# Patient Record
Sex: Male | Born: 1955 | Race: White | Hispanic: No | Marital: Single | Smoking: Former smoker
Health system: Southern US, Community
[De-identification: ages and names within clinical notes are randomized; demographics above are authoritative.]

## PROBLEM LIST (undated history)

## (undated) DIAGNOSIS — G4710 Hypersomnia, unspecified: Secondary | ICD-10-CM

## (undated) DIAGNOSIS — G4730 Sleep apnea, unspecified: Secondary | ICD-10-CM

## (undated) DIAGNOSIS — Z87442 Personal history of urinary calculi: Secondary | ICD-10-CM

## (undated) DIAGNOSIS — E785 Hyperlipidemia, unspecified: Secondary | ICD-10-CM

## (undated) HISTORY — DX: Sleep apnea, unspecified: G47.30

## (undated) HISTORY — DX: Personal history of urinary calculi: Z87.442

## (undated) HISTORY — DX: Hypersomnia, unspecified: G47.10

## (undated) HISTORY — DX: Hyperlipidemia, unspecified: E78.5

---
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
## 1979-03-20 HISTORY — PX: BACK SURGERY: SHX140

## 1999-11-22 ENCOUNTER — Encounter: Payer: Self-pay | Admitting: Emergency Medicine

## 1999-11-22 ENCOUNTER — Emergency Department (HOSPITAL_COMMUNITY): Admission: EM | Admit: 1999-11-22 | Discharge: 1999-11-22 | Payer: Self-pay | Admitting: Emergency Medicine

## 2007-04-03 ENCOUNTER — Ambulatory Visit: Payer: Self-pay | Admitting: Internal Medicine

## 2007-04-03 DIAGNOSIS — Z87442 Personal history of urinary calculi: Secondary | ICD-10-CM

## 2007-04-03 DIAGNOSIS — E785 Hyperlipidemia, unspecified: Secondary | ICD-10-CM

## 2007-04-03 DIAGNOSIS — G4710 Hypersomnia, unspecified: Secondary | ICD-10-CM

## 2007-04-03 DIAGNOSIS — G4730 Sleep apnea, unspecified: Secondary | ICD-10-CM

## 2007-04-03 HISTORY — DX: Personal history of urinary calculi: Z87.442

## 2007-04-03 HISTORY — DX: Sleep apnea, unspecified: G47.10

## 2007-04-03 HISTORY — DX: Sleep apnea, unspecified: G47.30

## 2007-04-03 HISTORY — DX: Hyperlipidemia, unspecified: E78.5

## 2007-04-11 ENCOUNTER — Telehealth: Payer: Self-pay | Admitting: Internal Medicine

## 2007-04-24 ENCOUNTER — Encounter: Payer: Self-pay | Admitting: Internal Medicine

## 2007-04-24 ENCOUNTER — Ambulatory Visit (HOSPITAL_BASED_OUTPATIENT_CLINIC_OR_DEPARTMENT_OTHER): Admission: RE | Admit: 2007-04-24 | Discharge: 2007-04-24 | Payer: Self-pay | Admitting: Internal Medicine

## 2007-05-06 ENCOUNTER — Ambulatory Visit: Payer: Self-pay | Admitting: Pulmonary Disease

## 2007-05-12 ENCOUNTER — Telehealth: Payer: Self-pay | Admitting: Internal Medicine

## 2007-05-14 ENCOUNTER — Encounter: Payer: Self-pay | Admitting: Internal Medicine

## 2007-05-27 ENCOUNTER — Ambulatory Visit: Payer: Self-pay | Admitting: Internal Medicine

## 2007-05-30 LAB — CONVERTED CEMR LAB
Cholesterol: 219 mg/dL (ref 0–200)
Direct LDL: 135.7 mg/dL
HDL: 31.5 mg/dL — ABNORMAL LOW (ref >39.0)
Total CHOL/HDL Ratio: 7
Triglycerides: 221 mg/dL (ref 0–149)
VLDL: 44 mg/dL — ABNORMAL HIGH (ref 0–40)

## 2007-08-28 ENCOUNTER — Ambulatory Visit: Payer: Self-pay | Admitting: Internal Medicine

## 2007-09-01 ENCOUNTER — Ambulatory Visit: Payer: Self-pay | Admitting: Internal Medicine

## 2010-02-23 ENCOUNTER — Ambulatory Visit: Payer: Self-pay | Admitting: Internal Medicine

## 2010-02-23 LAB — CONVERTED CEMR LAB
ALT: 34 units/L (ref 0–53)
AST: 25 units/L (ref 0–37)
Albumin: 4 g/dL (ref 3.5–5.2)
BUN: 17 mg/dL (ref 6–23)
Basophils Relative: 0.5 % (ref 0.0–3.0)
Blood in Urine, dipstick: NEGATIVE
Chloride: 105 meq/L (ref 96–112)
Eosinophils Relative: 1.8 % (ref 0.0–5.0)
GFR calc non Af Amer: 67.89 mL/min (ref >60.00)
Glucose, Bld: 157 mg/dL — ABNORMAL HIGH (ref 70–99)
Glucose, Urine, Semiquant: NEGATIVE
HCT: 44.8 % (ref 39.0–52.0)
HDL: 33.7 mg/dL — ABNORMAL LOW (ref >39.00)
Hemoglobin: 15.7 g/dL (ref 13.0–17.0)
Lymphs Abs: 2.1 10*3/uL (ref 0.7–4.0)
MCV: 91.5 fL (ref 78.0–100.0)
Monocytes Absolute: 0.5 10*3/uL (ref 0.1–1.0)
Monocytes Relative: 9 % (ref 3.0–12.0)
Neutro Abs: 3 10*3/uL (ref 1.4–7.7)
Nitrite: NEGATIVE
PSA: 0.25 ng/mL (ref 0.10–4.00)
Potassium: 4.3 meq/L (ref 3.5–5.1)
Sodium: 140 meq/L (ref 135–145)
Specific Gravity, Urine: 1.025
TSH: 1.42 microintl units/mL (ref 0.35–5.50)
Total Protein: 7.1 g/dL (ref 6.0–8.3)
VLDL: 83.4 mg/dL — ABNORMAL HIGH (ref 0.0–40.0)
WBC Urine, dipstick: NEGATIVE
WBC: 5.8 10*3/uL (ref 4.5–10.5)
pH: 5.5

## 2010-03-03 ENCOUNTER — Encounter: Payer: Self-pay | Admitting: Internal Medicine

## 2010-03-03 ENCOUNTER — Ambulatory Visit: Payer: Self-pay | Admitting: Internal Medicine

## 2010-03-03 DIAGNOSIS — N529 Male erectile dysfunction, unspecified: Secondary | ICD-10-CM | POA: Insufficient documentation

## 2010-03-03 DIAGNOSIS — E1165 Type 2 diabetes mellitus with hyperglycemia: Secondary | ICD-10-CM

## 2010-03-07 ENCOUNTER — Encounter: Payer: Self-pay | Admitting: Gastroenterology

## 2010-03-09 LAB — CONVERTED CEMR LAB
Testosterone-% Free: 2.5 % (ref 1.6–2.9)
Testosterone: 278.36 ng/dL (ref 250–890)

## 2010-04-04 ENCOUNTER — Encounter (INDEPENDENT_AMBULATORY_CARE_PROVIDER_SITE_OTHER): Payer: Self-pay | Admitting: *Deleted

## 2010-04-06 ENCOUNTER — Ambulatory Visit
Admission: RE | Admit: 2010-04-06 | Discharge: 2010-04-06 | Payer: Self-pay | Source: Home / Self Care | Attending: Gastroenterology | Admitting: Gastroenterology

## 2010-04-12 ENCOUNTER — Ambulatory Visit
Admission: RE | Admit: 2010-04-12 | Discharge: 2010-04-12 | Payer: Self-pay | Source: Home / Self Care | Attending: Internal Medicine | Admitting: Internal Medicine

## 2010-04-12 ENCOUNTER — Other Ambulatory Visit: Payer: Self-pay | Admitting: Internal Medicine

## 2010-04-12 LAB — LIPID PANEL
Cholesterol: 187 mg/dL (ref 0–200)
HDL: 32.3 mg/dL — ABNORMAL LOW (ref >39.00)
Total CHOL/HDL Ratio: 6
Triglycerides: 206 mg/dL — ABNORMAL HIGH (ref 0.0–149.0)
VLDL: 41.2 mg/dL — ABNORMAL HIGH (ref 0.0–40.0)

## 2010-04-12 LAB — HEPATIC FUNCTION PANEL
ALT: 35 U/L (ref 0–53)
AST: 26 U/L (ref 0–37)
Albumin: 4.1 g/dL (ref 3.5–5.2)
Alkaline Phosphatase: 76 U/L (ref 39–117)
Bilirubin, Direct: 0.1 mg/dL (ref 0.0–0.3)
Total Bilirubin: 0.9 mg/dL (ref 0.3–1.2)
Total Protein: 7.1 g/dL (ref 6.0–8.3)

## 2010-04-12 LAB — LDL CHOLESTEROL, DIRECT: Direct LDL: 120.8 mg/dL

## 2010-04-15 ENCOUNTER — Encounter: Payer: Self-pay | Admitting: Internal Medicine

## 2010-04-16 LAB — CONVERTED CEMR LAB
ALT: 34 units/L (ref 0–53)
AST: 24 units/L (ref 0–37)
Albumin: 4.2 g/dL (ref 3.5–5.2)
Alkaline Phosphatase: 84 units/L (ref 39–117)
BUN: 16 mg/dL (ref 6–23)
Basophils Absolute: 0 10*3/uL (ref 0.0–0.1)
Basophils Relative: 0.3 % (ref 0.0–1.0)
Bilirubin, Direct: 0.1 mg/dL (ref 0.0–0.3)
CO2: 29 meq/L (ref 19–32)
Calcium: 9.6 mg/dL (ref 8.4–10.5)
Chloride: 104 meq/L (ref 96–112)
Creatinine, Ser: 1 mg/dL (ref 0.4–1.5)
Eosinophils Absolute: 0.1 10*3/uL (ref 0.0–0.6)
Eosinophils Relative: 2.2 % (ref 0.0–5.0)
GFR calc Af Amer: 102 mL/min
GFR calc non Af Amer: 84 mL/min
Glucose, Bld: 107 mg/dL — ABNORMAL HIGH (ref 70–99)
HCT: 47.7 % (ref 39.0–52.0)
Hemoglobin: 16.6 g/dL (ref 13.0–17.0)
Lymphocytes Relative: 35.6 % (ref 12.0–46.0)
MCHC: 34.7 g/dL (ref 30.0–36.0)
MCV: 91.8 fL (ref 78.0–100.0)
Monocytes Absolute: 0.5 10*3/uL (ref 0.2–0.7)
Monocytes Relative: 7.8 % (ref 3.0–11.0)
Neutro Abs: 3.6 10*3/uL (ref 1.4–7.7)
Neutrophils Relative %: 54.1 % (ref 43.0–77.0)
Platelets: 235 10*3/uL (ref 150–400)
Potassium: 4.9 meq/L (ref 3.5–5.1)
RBC: 5.19 M/uL (ref 4.22–5.81)
RDW: 12.2 % (ref 11.5–14.6)
Sodium: 141 meq/L (ref 135–145)
TSH: 1.42 microintl units/mL (ref 0.35–5.50)
Total Bilirubin: 0.9 mg/dL (ref 0.3–1.2)
Total Protein: 7.3 g/dL (ref 6.0–8.3)
WBC: 6.5 10*3/uL (ref 4.5–10.5)

## 2010-04-19 ENCOUNTER — Encounter: Payer: Self-pay | Admitting: Internal Medicine

## 2010-04-19 ENCOUNTER — Ambulatory Visit: Admit: 2010-04-19 | Payer: Self-pay | Admitting: Internal Medicine

## 2010-04-19 ENCOUNTER — Ambulatory Visit (INDEPENDENT_AMBULATORY_CARE_PROVIDER_SITE_OTHER): Payer: 59 | Admitting: Internal Medicine

## 2010-04-19 ENCOUNTER — Other Ambulatory Visit: Payer: Self-pay | Admitting: Internal Medicine

## 2010-04-19 VITALS — BP 140/80 | HR 72 | Temp 98.3°F | Resp 14 | Ht 73.0 in | Wt 250.0 lb

## 2010-04-19 DIAGNOSIS — R079 Chest pain, unspecified: Secondary | ICD-10-CM

## 2010-04-19 DIAGNOSIS — E785 Hyperlipidemia, unspecified: Secondary | ICD-10-CM

## 2010-04-19 LAB — CK TOTAL AND CKMB (NOT AT ARMC)
Relative Index: 1.4 (ref 0.0–2.5)
Total CK: 139 U/L (ref 7–232)

## 2010-04-19 MED ORDER — TADALAFIL 20 MG PO TABS: 20.0000 mg | ORAL_TABLET | Freq: Every day | ORAL | Status: DC | PRN

## 2010-04-19 MED ORDER — PITAVASTATIN CALCIUM 2 MG PO TABS: 2.0000 mg | ORAL_TABLET | ORAL | Status: DC

## 2010-04-20 DIAGNOSIS — R079 Chest pain, unspecified: Secondary | ICD-10-CM | POA: Insufficient documentation

## 2010-04-20 LAB — D-DIMER, QUANTITATIVE: D-Dimer, Quant: 0.22 ug/mL-FEU (ref 0.00–0.48)

## 2010-04-20 NOTE — Letter (Signed)
Summary: Pre Visit Letter Revised  [facility]  [address] [place], [place] [address]   Phone: [phone]  Fax: [phone]        03/07/2010 MRN: [id number]  Ronnie Ortiz [address] [place], [place]  [address]             Procedure Date:  04-24-10 9:30am           Dr Jarold Motto   Welcome to the Gastroenterology Division at [facility].    You are scheduled to see a nurse for your pre-procedure visit on 04-06-2010 at 8am on the 3rd floor at [facility], [address]  We ask that you try to arrive at our office 15 minutes prior to your appointment time to allow for check-in.  Please take a minute to review the attached form.  If you answer "Yes" to one or more of the questions on the first page, we ask that you call the person listed at your earliest opportunity.  If you answer "No" to all of the questions, please complete the rest of the form and bring it to your appointment.    Your nurse visit will consist of discussing your medical and surgical history, your immediate family medical history, and your medications.   If you are unable to list all of your medications on the form, please bring the medication bottles to your appointment and we will list them.  We will need to be aware of both prescribed and over the counter drugs.  We will need to know exact dosage information as well.    Please be prepared to read and sign documents such as consent forms, a financial agreement, and acknowledgement forms.  If necessary, and with your consent, a friend or relative is welcome to sit-in on the nurse visit with you.  Please bring your insurance card so that we may make a copy of it.  If your insurance requires a referral to see a specialist, please bring your referral form from your primary care physician.  No co-pay is required for this nurse visit.     If you cannot keep your appointment, please call [phone] to cancel or reschedule prior to your  appointment date.  This allows [place] the opportunity to schedule an appointment for another patient in need of care.    Thank you for choosing [facility] for your medical needs.  We appreciate the opportunity to care for you.  Please visit [place] at our website  to learn more about our practice.  Sincerely, The Gastroenterology Division

## 2010-04-20 NOTE — Assessment & Plan Note (Signed)
Summary: CPX//CCM   Vital Signs:  Patient profile:   55 year old male Height:      72 inches Weight:      258 pounds BMI:     35.12 Temp:     98.4 degrees F oral Pulse rate:   72 / minute Pulse rhythm:   regular BP sitting:   124 / 82  (left arm) Cuff size:   large  Vitals Entered By: Alfred Levins, CMA (March 03, 2010 7:59 AM) CC: cpx   CC:  cpx.  History of Present Illness: CPX  new problem: unable to maintain erection libido decreased significantly  Current Problems (verified): 1)  Hypersomnia, Associated With Sleep Apnea  (ICD-780.53) 2)  Nephrolithiasis, Hx of  (ICD-V13.01) 3)  Hyperlipidemia  (ICD-272.4) 4)  Preventive Health Care  (ICD-V70.0)  Current Medications (verified): 1)  No Medications  Allergies (verified): No Known Drug Allergies  Past History:  Past Surgical History: Last updated: 04/03/2007 back surgery  1981 knee cap replacement-1970s  Family History: Last updated: 03/03/2010 father Cancer-pancreatic CA-84yo mother CHF-78yo deceased, diabetes brother MI 53 yo---deceased  Social History: Last updated: 04/03/2007 Single 2 kids healthy Occupation:[employer].  Former Smoker Regular exercise-no  Risk Factors: Exercise: no (04/03/2007)  Risk Factors: Smoking Status: quit (04/03/2007)  Past Medical History: Unremarkable Hyperlipidemia Nephrolithiasis, hx of hyperglycemia  Family History: father Cancer-pancreatic CA-84yo mother CHF-78yo deceased, diabetes brother MI 46 yo---deceased  Physical Exam  General:  overweight male in no acute distress. HEENT exam atraumatic, normocephalic symmetric her muscles are intact. Neck is supple without lymphadenopathy or thyromegaly. Chest clear auscultation cardiac exam S1-S2 are regular. Abdominal exam obese, to bowel sounds, soft and nontender. Extremities no clubbing cyanosis edema. Neurologic exam is alert with a normal gait.   Impression & Recommendations:  Problem # 1:   PREVENTIVE HEALTH CARE (ICD-V70.0) health maint UTD see problems Orders: Gastroenterology Referral (GI)  Problem # 2:  HYPERGLYCEMIA (ICD-790.29)  needs further evaluation. there is a good chance hei has diabetes. No family history. We'll check A1c.  Orders: Venipuncture (47829) TLB-A1C / Hgb A1C (Glycohemoglobin) (83036-A1C)  Problem # 3:  ERECTILE DYSFUNCTION, ORGANIC (ICD-607.84)  needs further evaluation. Check testosterone level. I suspect this problems related to obesity, age and hyperlipidemia. Diabetes may be playing some role.  Orders: T-Testosterone, Free and Total [phone])  Problem # 4:  HYPERLIPIDEMIA (ICD-272.4)  needs treatment. He has tried medications in the past with muscular side effects. Will try Livalo Side effects discussed.Marland Kitchen  His updated medication list for this problem includes:    Livalo 2 Mg Tabs (Pitavastatin calcium) .Marland Kitchen... 1/2 by mouth every other day  Complete Medication List: 1)  Livalo 2 Mg Tabs (Pitavastatin calcium) .... 1/2 by mouth every other day  Patient Instructions: 1)  6 weeks 2)  lipids 272.4 3)  liver 995.2    Orders Added: 1)  Gastroenterology Referral [GI] 2)  Est. Patient 40-64 years [99396] 3)  Est. Patient Level III [62952] 4)  Venipuncture [36415] 5)  TLB-A1C / Hgb A1C (Glycohemoglobin) [83036-A1C] 6)  T-Testosterone, Free and Total [id number]

## 2010-04-20 NOTE — Assessment & Plan Note (Signed)
Much improved. I have advised weight loss. He needs to participate in daily exercise. Continue current medications.

## 2010-04-20 NOTE — Miscellaneous (Signed)
Summary: Previsit [facility]   Clinical Lists Changes  Medications: Added new medication of MOVIPREP 100 GM  SOLR (PEG-KCL-NACL-NASULF-NA ASC-C) As per prep instructions. - Signed Rx of MOVIPREP 100 GM  SOLR (PEG-KCL-NACL-NASULF-NA ASC-C) As per prep instructions.;  #1 x 0;  Signed;  Entered by: Durwin Glaze RN;  Authorized by: Mardella Layman MD [facility];  Method used: Electronically to [facility] [address] [phone].*, [address], [place], [place], [place]  [address], Ph: [phone], Fax: [phone] Observations: Added new observation of NKA: T (04/06/2010 7:45)    Prescriptions: MOVIPREP 100 GM  SOLR (PEG-KCL-NACL-NASULF-NA ASC-C) As per prep instructions.  #1 x 0   Entered by:   Durwin Glaze RN   Authorized by:   Mardella Layman MD [facility]   Signed by:   Durwin Glaze RN on 04/06/2010   Method used:   Electronically to        [facility] [address] [phone].* (retail)       [address]       [place], [place]  [address]       Ph: [phone]       Fax: [phone]   RxID:   [id number]

## 2010-04-20 NOTE — Assessment & Plan Note (Signed)
Patient complains of chest discomfort. The pain is atypical. It been ongoing for quite some time. I don't think it is cardiac. Will do routine blood testing. Get a chest x-ray. EKG looks unremarkable. if persists without identifiable cause I think he'll need some sort of stress testing.

## 2010-04-20 NOTE — Progress Notes (Signed)
  Subjective:    Patient ID: Ronnie Ortiz, male    DOB: [birth year], 55 y.o.   MRN: [id number]  HPI  Patient comes in for followup. He is here for followup of hyperlipidemia. He's been on a statin for the past 6 weeks. He's been out for the past week. He has not had any side effects. He is taking an extremely low dose of the medication.  The patient complains of constant left-sided chest pain for the past 2 weeks. Not associated with exertion, deep breathing, cough. He doesn't remember any injury. He knows of no associated symptoms. He describes the discomfort as a pressure not really a pain.  Review of Systems Patient denies shortness of breath, PND, orthopnea. Denies abdominal pain, change in appetite, muscle aches. Denies rashes. No other complaints and a complete review of systems.    Objective:   Physical Exam    well-developed well-nourished male in no acute distress. HEENT exam atraumatic, normocephalic symmetric extraocular are intact. Neck is supple. Chest clear to auscultation cardiac exam S1-S2 irregular. Abdomen; soft. Extremities no edema.    Assessment & Plan:

## 2010-04-20 NOTE — Progress Notes (Signed)
Pt aware normal results

## 2010-04-20 NOTE — Letter (Signed)
Summary: [facility] Instructions  [facility]  [address] [place], [place] [address]   Phone: [phone]  Fax: [phone]       Ronnie Ortiz    [birth year]    MRN: [id number]        Procedure Day /Date:  Monday 04/24/2010     Arrival Time: 8:30 am     Procedure Time: 9:30 am     Location of Procedure:                    _ x_  [facility] (4th Floor)                        PREPARATION FOR COLONOSCOPY WITH MOVIPREP   Starting 5 days prior to your procedure Wednesday 2/1 do not eat nuts, seeds, popcorn, corn, beans, peas,  salads, or any raw vegetables.  Do not take any fiber supplements (e.g. Metamucil, Citrucel, and Benefiber).  THE DAY BEFORE YOUR PROCEDURE         DATE: Sunday 2/5  1.  Drink clear liquids the entire day-NO SOLID FOOD  2.  Do not drink anything colored red or purple.  Avoid juices with pulp.  No orange juice.  3.  Drink at least 64 oz. (8 glasses) of fluid/clear liquids during the day to prevent dehydration and help the prep work efficiently.  CLEAR LIQUIDS INCLUDE: Water Jello Ice Popsicles Tea (sugar ok, no milk/cream) Powdered fruit flavored drinks Coffee (sugar ok, no milk/cream) Gatorade Juice: apple, white grape, white cranberry  Lemonade Clear bullion, consomm, broth Carbonated beverages (any kind) Strained chicken noodle soup Hard Candy                             4.  In the morning, mix first dose of MoviPrep solution:    Empty 1 Pouch A and 1 Pouch B into the disposable container    Add lukewarm drinking water to the top line of the container. Mix to dissolve    Refrigerate (mixed solution should be used within 24 hrs)  5.  Begin drinking the prep at 5:00 p.m. The MoviPrep container is divided by 4 marks.   Every 15 minutes drink the solution down to the next mark (approximately 8 oz) until the full liter is complete.   6.  Follow completed prep with 16 oz of clear liquid of your choice (Nothing  red or purple).  Continue to drink clear liquids until bedtime.  7.  Before going to bed, mix second dose of MoviPrep solution:    Empty 1 Pouch A and 1 Pouch B into the disposable container    Add lukewarm drinking water to the top line of the container. Mix to dissolve    Refrigerate  THE DAY OF YOUR PROCEDURE      DATE: Monday 2/6  Beginning at 4:30 a.m. (5 hours before procedure):         1. Every 15 minutes, drink the solution down to the next mark (approx 8 oz) until the full liter is complete.  2. Follow completed prep with 16 oz. of clear liquid of your choice.    3. You may drink clear liquids until 7:30 am (2 HOURS BEFORE PROCEDURE).   MEDICATION INSTRUCTIONS  Unless otherwise instructed, you should take regular prescription medications with a small sip of water   as early as possible the morning of your  procedure.         OTHER INSTRUCTIONS  You will need a responsible adult at least 54 years of age to accompany you and drive you home.   This person must remain in the waiting room during your procedure.  Wear loose fitting clothing that is easily removed.  Leave jewelry and other valuables at home.  However, you may wish to bring a book to read or  an iPod/MP3 player to listen to music as you wait for your procedure to start.  Remove all body piercing jewelry and leave at home.  Total time from sign-in until discharge is approximately 2-3 hours.  You should go home directly after your procedure and rest.  You can resume normal activities the  day after your procedure.  The day of your procedure you should not:   Drive   Make legal decisions   Operate machinery   Drink alcohol   Return to work  You will receive specific instructions about eating, activities and medications before you leave.    The above instructions have been reviewed and explained to me by   Durwin Glaze RN  April 06, 2010 8:04 AM     I fully understand and can verbalize  these instructions _____________________________ Date _________

## 2010-04-24 ENCOUNTER — Other Ambulatory Visit (AMBULATORY_SURGERY_CENTER): Payer: 59 | Admitting: Gastroenterology

## 2010-04-24 ENCOUNTER — Encounter: Payer: Self-pay | Admitting: Gastroenterology

## 2010-04-24 DIAGNOSIS — K5730 Diverticulosis of large intestine without perforation or abscess without bleeding: Secondary | ICD-10-CM

## 2010-04-24 DIAGNOSIS — Z1211 Encounter for screening for malignant neoplasm of colon: Secondary | ICD-10-CM

## 2010-05-04 NOTE — Procedures (Signed)
Summary: Colonoscopy   Colonoscopy  Procedure date:  04/24/2010  Findings:      Location:  [facility].    Procedures Next Due Date:    Colonoscopy: 04/2020 COLONOSCOPY PROCEDURE REPORT  PATIENT:  Ronnie Ortiz, Ronnie Ortiz  MR#:  [id number] BIRTHDATE:   06/16/1955, 54 yrs. old   GENDER:   male ENDOSCOPIST:   Vania Rea. Jarold Motto, MD, [facility] REF. BY: Birdie Sons, M.D. PROCEDURE DATE:  04/24/2010 PROCEDURE:  Diagnostic Colonoscopy ASA CLASS:   Class I INDICATIONS: Routine Risk Screening  MEDICATIONS:    Fentanyl 75 mcg IV, Versed 8 mg IV  DESCRIPTION OF PROCEDURE:   After the risks benefits and alternatives of the procedure were thoroughly explained, informed consent was obtained.  Digital rectal exam was performed and revealed tender.   The LB [id number] endoscope was introduced through the anus and advanced to the cecum, which was identified by both the appendix and ileocecal valve, without limitations.  The quality of the prep was excellent, using MoviPrep.  The instrument was then slowly withdrawn as the colon was fully examined. <<PROCEDUREIMAGES>>        <<OLD IMAGES>>  FINDINGS:  Moderate diverticulosis was found in the sigmoid to descending colon segments. SCATTERED RIGHT COLON TICS ALSO NOTED.   Retroflexed views in the rectum revealed no abnormalities.    The scope was then withdrawn from the patient and the procedure completed.  COMPLICATIONS:   None ENDOSCOPIC IMPRESSION:  1) Moderate diverticulosis in the sigmoid to descending colon segments  2) No polyps or cancers RECOMMENDATIONS:  1) high fiber diet  2) Continue current colorectal screening recommendations for "routine risk" patients with a repeat colonoscopy in 10 years. REPEAT EXAM:   No   _______________________________ Vania Rea. Jarold Motto, MD, [facility]  CC:

## 2010-05-29 LAB — HM COLONOSCOPY

## 2010-08-01 NOTE — Procedures (Signed)
NAME:  Ronnie Ortiz, Ronnie Ortiz NO.:  [id number]   MEDICAL RECORD NO.:  [id number]          PATIENT TYPE:  OUT   LOCATION:  SLEEP CENTER                 FACILITY:  [facility]   PHYSICIAN:  Barbaraann Share, MD,FCCPDATE OF BIRTH:  March 28, 1955   DATE OF STUDY:  04/24/2007                            NOCTURNAL POLYSOMNOGRAM   REFERRING PHYSICIAN:   INDICATION FOR STUDY:  Hypersomnia with sleep apnea.   EPWORTH SLEEPINESS SCORE:  Sixteen.   SLEEP ARCHITECTURE:  The patient had a total sleep time of 128 minutes  during the diagnostic portion of the study and 234 minutes during CPAP  titration.  There was decreased REM and the patient never achieved slow  wave sleep.  Sleep onset latency was normal at 27 minutes and REM onset  was prolonged at 145 minutes.  Sleep efficiency was poor at 66% during  the first half of the night and improved at 86% the second half of the  night.   RESPIRATORY DATA:  Patient underwent split night protocol where he was  found to have 30 obstructive and central events in the first 128 minutes  of sleep.  This gave him an apnea/hypopnea index of 14 events per hour  during the diagnostic portion of the study.  The events occurred  primarily during supine REM and there was mild snoring noted throughout.  Protocol:  The patient was then placed on a medium Quatro full face mask  and ultimately titrated to a final pressure of 9 cm.  At this pressure  the patient had very few breakthrough obstructive events but did have an  increase in central events that were probably being induced by the CPAP  pressure.  At this point I would recommend a treatment pressure of 8 cm  of water and see how the patient responds clinically.   OXYGEN DATA:  The patient had O2 desaturation as low as 83% with his  obstructive events.   CARDIAC DATA:  No clinically significant arrhythmias were noted.   MOVEMENT-PARASOMNIA:  None.   IMPRESSIONS-RECOMMENDATIONS:  Mild obstructive  sleep apnea/hypopnea  syndrome with an apnea/hypopnea index of 14 events per hour and O2  desaturation as low as 83% during the diagnostic portion of the study.  Patient was then placed on CPAP with a medium Quatro full face mask and  titrated to a pressure of 9 cm of  water.  Because of CPAP-induced central events, I would recommend a  treatment pressure of 8 cm of water and see how the patient responds  clinically.  He should also be counseled on weight loss.      Barbaraann Share, MD,FCCP  Diplomate, American Board of Sleep  Medicine  Electronically Signed     [facility]/MEDQ  D:  05/07/2007 08:45:56  T:  05/07/2007 16:42:41  Job:  [id number]

## 2010-11-01 ENCOUNTER — Ambulatory Visit
Admission: RE | Admit: 2010-11-01 | Discharge: 2010-11-01 | Disposition: A | Discharge status: Home / Self Care | Payer: 59 | Source: Ambulatory Visit | Attending: Internal Medicine | Admitting: Internal Medicine

## 2010-12-20 ENCOUNTER — Encounter: Payer: Self-pay | Admitting: Family Medicine

## 2010-12-20 ENCOUNTER — Ambulatory Visit (INDEPENDENT_AMBULATORY_CARE_PROVIDER_SITE_OTHER): Payer: 59 | Admitting: Family Medicine

## 2010-12-20 VITALS — BP 122/80 | Temp 98.6°F | Wt 242.0 lb

## 2010-12-20 DIAGNOSIS — Z113 Encounter for screening for infections with a predominantly sexual mode of transmission: Secondary | ICD-10-CM

## 2010-12-20 NOTE — Progress Notes (Signed)
  Subjective:    Patient ID: Ronnie Ortiz, male    DOB: [birth year], 55 y.o.   MRN: [id number]  HPI Patient here to discuss STD screening. Started new relationship a few months ago. Previous unprotected intercourse over the past few years with other partners. He has not had any symptoms such as rash, fever or, dysuria, or penile discharge. No reported history of STD. Patient just wanted to make sure he has no infectious issues going forward.  History of erectile dysfunction and hyperlipidemia. No other chronic medical problems.   Review of Systems  Constitutional: Negative for fever and chills.  Genitourinary: Negative for dysuria, discharge and genital sores.  Skin: Negative for rash.  Hematological: Negative for adenopathy.       Objective:   Physical Exam  Constitutional: He appears well-developed and well-nourished.  Cardiovascular: Normal rate and regular rhythm.   Pulmonary/Chest: Effort normal and breath sounds normal. No respiratory distress. He has no wheezes. He has no rales.          Assessment & Plan:  STD screening. Patient asymptomatic. Check HIV, RPR, urine for chlamydia and GC, and hepatitis B surface antigen

## 2010-12-21 LAB — HIV ANTIBODY (ROUTINE TESTING W REFLEX): HIV: NONREACTIVE

## 2010-12-21 LAB — CHLAMYDIA PROBE AMPLIFICATION, URINE: Chlamydia, Swab/Urine, PCR: NEGATIVE

## 2010-12-21 LAB — RPR

## 2010-12-21 NOTE — Progress Notes (Signed)
Quick Note:  Pt informed, pt requested a copy, pt will pick-up ______

## 2011-02-19 ENCOUNTER — Telehealth: Payer: Self-pay | Admitting: Internal Medicine

## 2011-02-19 NOTE — Telephone Encounter (Signed)
Sounds viral Stay well hydrated. Take mucinex dm bid for 5 days.  Call for persistent fever, SOB

## 2011-02-19 NOTE — Telephone Encounter (Signed)
Pt is  Congested,has loss of appetite, fever, body aches,sinus headaches, cough, Fatigue and sore throat. Pt just started symptoms yesterday and is wanting an rx called in. Please contact

## 2011-02-19 NOTE — Telephone Encounter (Signed)
Pt aware.

## 2011-09-27 ENCOUNTER — Encounter: Payer: Self-pay | Admitting: Family

## 2011-09-27 ENCOUNTER — Ambulatory Visit (INDEPENDENT_AMBULATORY_CARE_PROVIDER_SITE_OTHER): Payer: 59 | Admitting: Family

## 2011-09-27 VITALS — BP 100/64 | Temp 98.4°F | Wt 237.0 lb

## 2011-09-27 DIAGNOSIS — Z7251 High risk heterosexual behavior: Secondary | ICD-10-CM

## 2011-09-27 DIAGNOSIS — L989 Disorder of the skin and subcutaneous tissue, unspecified: Secondary | ICD-10-CM

## 2011-09-27 DIAGNOSIS — Z113 Encounter for screening for infections with a predominantly sexual mode of transmission: Secondary | ICD-10-CM

## 2011-09-27 DIAGNOSIS — B078 Other viral warts: Secondary | ICD-10-CM

## 2011-09-27 NOTE — Progress Notes (Signed)
Subjective:    Patient ID: Ronnie Ortiz, male    DOB: 08-Apr-1955, 56 y.o.   MRN: [id number]  HPI 56 year old white male, nonsmoker, patient of Dr. Caryl Never is in today requesting STD testing. He denies any known exposure to any sexually transmitted diseases. Denies any burning with urination,  abdominal pain or back pain.  Patient has a common wart to the fourth digit on his right hand. Request and a have it frozen today. He would like a referral to dermatology to have a lesion removed from his nose. Reports irritation during the allergy season.   Review of Systems  Constitutional: Negative.   Respiratory: Negative.   Cardiovascular: Negative.   Genitourinary: Negative for dysuria and frequency.  Skin: Negative.        Wart on the left, 4th finger  Neurological: Negative.    Past Medical History  Diagnosis Date  . HYPERLIPIDEMIA 04/03/2007  . HYPERSOMNIA, ASSOCIATED WITH SLEEP APNEA 04/03/2007  . NEPHROLITHIASIS, HX OF 04/03/2007    History   Social History  . Marital Status: Single    Spouse Name: N/A    Number of Children: N/A  . Years of Education: N/A   Occupational History  . Not on file.   Social History Main Topics  . Smoking status: Former Games developer  . Smokeless tobacco: Not on file  . Alcohol Use: Yes  . Drug Use: Not on file  . Sexually Active: Not on file   Other Topics Concern  . Not on file   Social History Narrative  . No narrative on file    Past Surgical History  Procedure Date  . Back surgery 1981  . Patella reconstruction 1970's    Family History  Problem Relation Age of Onset  . Diabetes Mother   . Pancreatic cancer Father   . Heart attack Brother     No Known Allergies  Current Outpatient Prescriptions on File Prior to Visit  Medication Sig Dispense Refill  . Pitavastatin Calcium (LIVALO) 2 MG TABS Take 1 tablet (2 mg total) by mouth every other day. 1/2 tablet  30 tablet  11  . tadalafil (CIALIS) 20 MG tablet Take 1 tablet (20  mg total) by mouth daily as needed.  10 tablet  11    BP 100/64  Temp 98.4 F (36.9 C) (Oral)  Wt 237 lb (107.502 kg)chart    Objective:   Physical Exam  Constitutional: He is oriented to person, place, and time. He appears well-developed and well-nourished.  Cardiovascular: Normal rate, regular rhythm and normal heart sounds.   Pulmonary/Chest: Effort normal and breath sounds normal.  Abdominal: Soft. Bowel sounds are normal. There is no tenderness.  Neurological: He is alert and oriented to person, place, and time.  Skin: Skin is warm and dry.       Common wart noted to the medial right 4th digit of the right hand.  Psychiatric: He has a normal mood and affect.    Informed consent was obtained and the site was treated with 20 % acetic acid. The color of the lesion changed to white with the application of acid. The patient tolerated the procedure well and  aftercare instructions were given to the patient.      Assessment & Plan:  Assessment: High risk sexual behavior, screening for sexually transmitted diseases, common wart, skin lesion  Plan: Educated patient on the process of freezing and the likelihood of the area of blistering, and aftercare. He should verbalizes understanding. Lab sent to include  GC, RPR, hepatitis panel. Patient declines herpes testing. Will notify patient pending results. Refer to derm for facial lesion.

## 2011-09-28 LAB — GC/CHLAMYDIA PROBE AMP, GENITAL: Chlamydia, DNA Probe: NEGATIVE

## 2011-09-28 LAB — HEPATITIS PANEL, ACUTE
HCV Ab: NEGATIVE
Hep A IgM: NEGATIVE
Hep B C IgM: NEGATIVE
Hepatitis B Surface Ag: NEGATIVE

## 2011-09-28 LAB — RPR

## 2011-09-28 LAB — HIV ANTIBODY (ROUTINE TESTING W REFLEX): HIV: NONREACTIVE

## 2013-04-21 ENCOUNTER — Other Ambulatory Visit: Payer: 59

## 2013-04-27 ENCOUNTER — Telehealth: Payer: Self-pay | Admitting: Internal Medicine

## 2013-04-27 NOTE — Telephone Encounter (Signed)
yes

## 2013-04-27 NOTE — Telephone Encounter (Signed)
Pt was scheduled for a physical with dr.swords on 05/26/13, however needs to reschedule because he will be out of town. Pt is requesting dr. Caryl Neverburchette for the physical if possible. Ok to schedule?

## 2013-05-18 NOTE — Telephone Encounter (Signed)
Pt is schedule to see Dr. Caryl NeverBurchette for CPX

## 2013-05-19 ENCOUNTER — Other Ambulatory Visit: Payer: 59

## 2013-05-26 ENCOUNTER — Encounter: Payer: 59 | Admitting: Internal Medicine

## 2013-06-02 ENCOUNTER — Other Ambulatory Visit (INDEPENDENT_AMBULATORY_CARE_PROVIDER_SITE_OTHER): Payer: 59

## 2013-06-02 DIAGNOSIS — Z0000 Encounter for general adult medical examination without abnormal findings: Secondary | ICD-10-CM

## 2013-06-02 LAB — HEPATIC FUNCTION PANEL
ALK PHOS: 79 U/L (ref 39–117)
ALT: 50 U/L (ref 0–53)
AST: 22 U/L (ref 0–37)
Albumin: 4.1 g/dL (ref 3.5–5.2)
BILIRUBIN DIRECT: 0.1 mg/dL (ref 0.0–0.3)
BILIRUBIN TOTAL: 0.8 mg/dL (ref 0.3–1.2)
Total Protein: 6.9 g/dL (ref 6.0–8.3)

## 2013-06-02 LAB — POCT URINALYSIS DIPSTICK
Bilirubin, UA: NEGATIVE
Blood, UA: NEGATIVE
KETONES UA: NEGATIVE
Leukocytes, UA: NEGATIVE
Nitrite, UA: NEGATIVE
Urobilinogen, UA: 0.2
pH, UA: 5

## 2013-06-02 LAB — BASIC METABOLIC PANEL
BUN: 20 mg/dL (ref 6–23)
CALCIUM: 9.1 mg/dL (ref 8.4–10.5)
CO2: 25 mEq/L (ref 19–32)
CREATININE: 1.1 mg/dL (ref 0.4–1.5)
Chloride: 102 mEq/L (ref 96–112)
GFR: 76.65 mL/min (ref >60.00)
Glucose, Bld: 223 mg/dL — ABNORMAL HIGH (ref 70–99)
Potassium: 4.2 mEq/L (ref 3.5–5.1)
Sodium: 136 mEq/L (ref 135–145)

## 2013-06-02 LAB — MICROALBUMIN / CREATININE URINE RATIO
Creatinine,U: 281.7 mg/dL
MICROALB/CREAT RATIO: 0.5 mg/g (ref 0.0–30.0)
Microalb, Ur: 1.3 mg/dL (ref 0.0–1.9)

## 2013-06-02 LAB — CBC WITH DIFFERENTIAL/PLATELET
BASOS PCT: 0.3 % (ref 0.0–3.0)
Basophils Absolute: 0 10*3/uL (ref 0.0–0.1)
EOS ABS: 0.2 10*3/uL (ref 0.0–0.7)
EOS PCT: 2.5 % (ref 0.0–5.0)
HCT: 46.5 % (ref 39.0–52.0)
Hemoglobin: 15.9 g/dL (ref 13.0–17.0)
LYMPHS PCT: 37 % (ref 12.0–46.0)
Lymphs Abs: 2.2 10*3/uL (ref 0.7–4.0)
MCHC: 34.2 g/dL (ref 30.0–36.0)
MCV: 90.7 fl (ref 78.0–100.0)
MONO ABS: 0.4 10*3/uL (ref 0.1–1.0)
Monocytes Relative: 7.4 % (ref 3.0–12.0)
NEUTROS PCT: 52.8 % (ref 43.0–77.0)
Neutro Abs: 3.1 10*3/uL (ref 1.4–7.7)
PLATELETS: 246 10*3/uL (ref 150.0–400.0)
RBC: 5.13 Mil/uL (ref 4.22–5.81)
RDW: 12.8 % (ref 11.5–14.6)
WBC: 5.9 10*3/uL (ref 4.5–10.5)

## 2013-06-02 LAB — LIPID PANEL
CHOL/HDL RATIO: 6
CHOLESTEROL: 224 mg/dL — AB (ref 0–200)
HDL: 34.8 mg/dL — AB (ref >39.00)
LDL CALC: 118 mg/dL — AB (ref 0–99)
TRIGLYCERIDES: 355 mg/dL — AB (ref 0.0–149.0)
VLDL: 71 mg/dL — ABNORMAL HIGH (ref 0.0–40.0)

## 2013-06-02 LAB — TSH: TSH: 2.08 u[IU]/mL (ref 0.35–5.50)

## 2013-06-02 LAB — PSA: PSA: 0.31 ng/mL (ref 0.10–4.00)

## 2013-06-02 LAB — HEMOGLOBIN A1C: HEMOGLOBIN A1C: 9.9 % — AB (ref 4.6–6.5)

## 2013-06-02 NOTE — Addendum Note (Signed)
Addended by: Rita OharaHRASHER, Naren Benally R on: 06/02/2013 09:42 AM   Modules accepted: Orders

## 2013-06-08 ENCOUNTER — Encounter: Payer: Self-pay | Admitting: Family Medicine

## 2013-06-08 ENCOUNTER — Ambulatory Visit (INDEPENDENT_AMBULATORY_CARE_PROVIDER_SITE_OTHER): Payer: 59 | Admitting: Family Medicine

## 2013-06-08 VITALS — BP 122/70 | HR 68 | Temp 98.4°F | Ht 72.0 in | Wt 237.0 lb

## 2013-06-08 DIAGNOSIS — E119 Type 2 diabetes mellitus without complications: Secondary | ICD-10-CM

## 2013-06-08 DIAGNOSIS — E1165 Type 2 diabetes mellitus with hyperglycemia: Secondary | ICD-10-CM | POA: Insufficient documentation

## 2013-06-08 DIAGNOSIS — K5790 Diverticulosis of intestine, part unspecified, without perforation or abscess without bleeding: Secondary | ICD-10-CM | POA: Insufficient documentation

## 2013-06-08 DIAGNOSIS — Z0000 Encounter for general adult medical examination without abnormal findings: Secondary | ICD-10-CM

## 2013-06-08 DIAGNOSIS — E785 Hyperlipidemia, unspecified: Secondary | ICD-10-CM

## 2013-06-08 DIAGNOSIS — K5730 Diverticulosis of large intestine without perforation or abscess without bleeding: Secondary | ICD-10-CM

## 2013-06-08 MED ORDER — METFORMIN HCL 500 MG PO TABS: 500.0000 mg | ORAL_TABLET | Freq: Two times a day (BID) | ORAL | Status: DC

## 2013-06-08 MED ORDER — PITAVASTATIN CALCIUM 2 MG PO TABS: 1.0000 | ORAL_TABLET | Freq: Every day | ORAL | Status: DC

## 2013-06-08 NOTE — Progress Notes (Signed)
Pre visit review using our clinic review tool, if applicable. No additional management support is needed unless otherwise documented below in the visit note. 

## 2013-06-08 NOTE — Progress Notes (Signed)
   Subjective:    Patient ID: Ronnie Ortiz, male    DOB: [birth year], 57 y.o.   MRN: [id number]  HPI Patient here for complete physical. He has history of dyslipidemia has had hyperglycemia but never diagnosed with type 2 diabetes. He denies any symptoms of hyperglycemia. He's lost about 40 pounds over the past year due to his efforts. Denies any polyuria or polydipsia. His fiance has celiac disease and so he is following gluten-free diet. He has previously taken Livalo for hyperlipidemia. Last tetanus 2007. Colonoscopy 2012 showed diverticulosis. No history of colon polyps. Nonsmoker. Family history reviewed and as below  Past Medical History  Diagnosis Date  . HYPERLIPIDEMIA 04/03/2007  . HYPERSOMNIA, ASSOCIATED WITH SLEEP APNEA 04/03/2007  . NEPHROLITHIASIS, HX OF 04/03/2007   Past Surgical History  Procedure Laterality Date  . Back surgery  1981  . Patella reconstruction  1970's    reports that he has quit smoking. He does not have any smokeless tobacco history on file. He reports that he drinks alcohol. His drug history is not on file. family history includes Diabetes in his mother; Heart attack in his brother; Heart disease (age of onset: 57) in his mother; Pancreatic cancer in his father. No Known Allergies    Review of Systems  Constitutional: Negative for fever, activity change, appetite change, fatigue and unexpected weight change.  HENT: Negative for congestion, ear pain and trouble swallowing.   Eyes: Negative for pain and visual disturbance.  Respiratory: Negative for cough, shortness of breath and wheezing.   Cardiovascular: Negative for chest pain and palpitations.  Gastrointestinal: Negative for nausea, vomiting, abdominal pain, diarrhea, constipation, blood in stool, abdominal distention and rectal pain.  Endocrine: Negative for polydipsia and polyuria.  Genitourinary: Negative for dysuria, hematuria and testicular pain.  Musculoskeletal: Negative for arthralgias  and joint swelling.  Skin: Negative for rash.  Neurological: Negative for dizziness, syncope and headaches.  Hematological: Negative for adenopathy.  Psychiatric/Behavioral: Negative for confusion and dysphoric mood.       Objective:   Physical Exam  Constitutional: He is oriented to person, place, and time. He appears well-developed and well-nourished. No distress.  HENT:  Head: Normocephalic and atraumatic.  Right Ear: External ear normal.  Left Ear: External ear normal.  Mouth/Throat: Oropharynx is clear and moist.  Eyes: Conjunctivae and EOM are normal. Pupils are equal, round, and reactive to light.  Neck: Normal range of motion. Neck supple. No thyromegaly present.  Cardiovascular: Normal rate, regular rhythm and normal heart sounds.   No murmur heard. Pulmonary/Chest: No respiratory distress. He has no wheezes. He has no rales.  Abdominal: Soft. Bowel sounds are normal. He exhibits no distension and no mass. There is no tenderness. There is no rebound and no guarding.  Musculoskeletal: He exhibits no edema.  Lymphadenopathy:    He has no cervical adenopathy.  Neurological: He is alert and oriented to person, place, and time. He displays normal reflexes. No cranial nerve deficit.  Skin: No rash noted.  Psychiatric: He has a normal mood and affect.          Assessment & Plan:  Complete physical. Labs reviewed and notable for glucose 223, A1c 9.9%, LDL 118. Start metformin 500 mg twice a day. Start back Livalo 2 mg once daily. Continue weight loss and exercise efforts. Reassess 3 months and check A1c and lipids at that point.

## 2013-06-08 NOTE — Patient Instructions (Signed)
Hypertriglyceridemia  Diet for High blood levels of Triglycerides Most fats in food are triglycerides. Triglycerides in your blood are stored as fat in your body. High levels of triglycerides in your blood may put you at a greater risk for heart disease and stroke.  Normal triglyceride levels are less than 150 mg/dL. Borderline high levels are 150-199 mg/dl. High levels are 200 - 499 mg/dL, and very high triglyceride levels are greater than 500 mg/dL. The decision to treat high triglycerides is generally based on the level. For people with borderline or high triglyceride levels, treatment includes weight loss and exercise. Drugs are recommended for people with very high triglyceride levels. Many people who need treatment for high triglyceride levels have metabolic syndrome. This syndrome is a collection of disorders that often include: insulin resistance, high blood pressure, blood clotting problems, high cholesterol and triglycerides. TESTING PROCEDURE FOR TRIGLYCERIDES  You should not eat 4 hours before getting your triglycerides measured. The normal range of triglycerides is between 10 and 250 milligrams per deciliter (mg/dl). Some people may have extreme levels (1000 or above), but your triglyceride level may be too high if it is above 150 mg/dl, depending on what other risk factors you have for heart disease.  People with high blood triglycerides may also have high blood cholesterol levels. If you have high blood cholesterol as well as high blood triglycerides, your risk for heart disease is probably greater than if you only had high triglycerides. High blood cholesterol is one of the main risk factors for heart disease. CHANGING YOUR DIET  Your weight can affect your blood triglyceride level. If you are more than 20% above your ideal body weight, you may be able to lower your blood triglycerides by losing weight. Eating less and exercising regularly is the best way to combat this. Fat provides more  calories than any other food. The best way to lose weight is to eat less fat. Only 30% of your total calories should come from fat. Less than 7% of your diet should come from saturated fat. A diet low in fat and saturated fat is the same as a diet to decrease blood cholesterol. By eating a diet lower in fat, you may lose weight, lower your blood cholesterol, and lower your blood triglyceride level.  Eating a diet low in fat, especially saturated fat, may also help you lower your blood triglyceride level. Ask your dietitian to help you figure how much fat you can eat based on the number of calories your caregiver has prescribed for you.  Exercise, in addition to helping with weight loss may also help lower triglyceride levels.   Alcohol can increase blood triglycerides. You may need to stop drinking alcoholic beverages.  Too much carbohydrate in your diet may also increase your blood triglycerides. Some complex carbohydrates are necessary in your diet. These may include bread, rice, potatoes, other starchy vegetables and cereals.  Reduce "simple" carbohydrates. These may include pure sugars, candy, honey, and jelly without losing other nutrients. If you have the kind of high blood triglycerides that is affected by the amount of carbohydrates in your diet, you will need to eat less sugar and less high-sugar foods. Your caregiver can help you with this.  Adding 2-4 grams of fish oil (EPA+ DHA) may also help lower triglycerides. Speak with your caregiver before adding any supplements to your regimen. Following the Diet  Maintain your ideal weight. Your caregivers can help you with a diet. Generally, eating less food and getting more   exercise will help you lose weight. Joining a weight control group may also help. Ask your caregivers for a good weight control group in your area.  Eat low-fat foods instead of high-fat foods. This can help you lose weight too.  These foods are lower in fat. Eat MORE of these:    Dried beans, peas, and lentils.  Egg whites.  Low-fat cottage cheese.  Fish.  Lean cuts of meat, such as round, sirloin, rump, and flank (cut extra fat off meat you fix).  Whole grain breads, cereals and pasta.  Skim and nonfat dry milk.  Low-fat yogurt.  Poultry without the skin.  Cheese made with skim or part-skim milk, such as mozzarella, parmesan, farmers', ricotta, or pot cheese. These are higher fat foods. Eat LESS of these:   Whole milk and foods made from whole milk, such as American, blue, cheddar, monterey jack, and swiss cheese  High-fat meats, such as luncheon meats, sausages, knockwurst, bratwurst, hot dogs, ribs, corned beef, ground pork, and regular ground beef.  Fried foods. Limit saturated fats in your diet. Substituting unsaturated fat for saturated fat may decrease your blood triglyceride level. You will need to read package labels to know which products contain saturated fats.  These foods are high in saturated fat. Eat LESS of these:   Fried pork skins.  Whole milk.  Skin and fat from poultry.  Palm oil.  Butter.  Shortening.  Cream cheese.  Bacon.  Margarines and baked goods made from listed oils.  Vegetable shortenings.  Chitterlings.  Fat from meats.  Coconut oil.  Palm kernel oil.  Lard.  Cream.  Sour cream.  Fatback.  Coffee whiteners and non-dairy creamers made with these oils.  Cheese made from whole milk. Use unsaturated fats (both polyunsaturated and monounsaturated) moderately. Remember, even though unsaturated fats are better than saturated fats; you still want a diet low in total fat.  These foods are high in unsaturated fat:   Canola oil.  Sunflower oil.  Mayonnaise.  Almonds.  Peanuts.  Pine nuts.  Margarines made with these oils.  Safflower oil.  Olive oil.  Avocados.  Cashews.  Peanut butter.  Sunflower seeds.  Soybean oil.  Peanut  oil.  Olives.  Pecans.  Walnuts.  Pumpkin seeds. Avoid sugar and other high-sugar foods. This will decrease carbohydrates without decreasing other nutrients. Sugar in your food goes rapidly to your blood. When there is excess sugar in your blood, your liver may use it to make more triglycerides. Sugar also contains calories without other important nutrients.  Eat LESS of these:   Sugar, brown sugar, powdered sugar, jam, jelly, preserves, honey, syrup, molasses, pies, candy, cakes, cookies, frosting, pastries, colas, soft drinks, punches, fruit drinks, and regular gelatin.  Avoid alcohol. Alcohol, even more than sugar, may increase blood triglycerides. In addition, alcohol is high in calories and low in nutrients. Ask for sparkling water, or a diet soft drink instead of an alcoholic beverage. Suggestions for planning and preparing meals   Bake, broil, grill or roast meats instead of frying.  Remove fat from meats and skin from poultry before cooking.  Add spices, herbs, lemon juice or vinegar to vegetables instead of salt, rich sauces or gravies.  Use a non-stick skillet without fat or use no-stick sprays.  Cool and refrigerate stews and broth. Then remove the hardened fat floating on the surface before serving.  Refrigerate meat drippings and skim off fat to make low-fat gravies.  Serve more fish.  Use less butter,   margarine and other high-fat spreads on bread or vegetables.  Use skim or reconstituted non-fat dry milk for cooking.  Cook with low-fat cheeses.  Substitute low-fat yogurt or cottage cheese for all or part of the sour cream in recipes for sauces, dips or congealed salads.  Use half yogurt/half mayonnaise in salad recipes.  Substitute evaporated skim milk for cream. Evaporated skim milk or reconstituted non-fat dry milk can be whipped and substituted for whipped cream in certain recipes.  Choose fresh fruits for dessert instead of high-fat foods such as pies or  cakes. Fruits are naturally low in fat. When Dining Out   Order low-fat appetizers such as fruit or vegetable juice, pasta with vegetables or tomato sauce.  Select clear, rather than cream soups.  Ask that dressings and gravies be served on the side. Then use less of them.  Order foods that are baked, broiled, poached, steamed, stir-fried, or roasted.  Ask for margarine instead of butter, and use only a small amount.  Drink sparkling water, unsweetened tea or coffee, or diet soft drinks instead of alcohol or other sweet beverages. QUESTIONS AND ANSWERS ABOUT OTHER FATS IN THE BLOOD: SATURATED FAT, TRANS FAT, AND CHOLESTEROL What is trans fat? Trans fat is a type of fat that is formed when vegetable oil is hardened through a process called hydrogenation. This process helps makes foods more solid, gives them shape, and prolongs their shelf life. Trans fats are also called hydrogenated or partially hydrogenated oils.  What do saturated fat, trans fat, and cholesterol in foods have to do with heart disease? Saturated fat, trans fat, and cholesterol in the diet all raise the level of LDL "bad" cholesterol in the blood. The higher the LDL cholesterol, the greater the risk for coronary heart disease (CHD). Saturated fat and trans fat raise LDL similarly.  What foods contain saturated fat, trans fat, and cholesterol? High amounts of saturated fat are found in animal products, such as fatty cuts of meat, chicken skin, and full-fat dairy products like butter, whole milk, cream, and cheese, and in tropical vegetable oils such as palm, palm kernel, and coconut oil. Trans fat is found in some of the same foods as saturated fat, such as vegetable shortening, some margarines (especially hard or stick margarine), crackers, cookies, baked goods, fried foods, salad dressings, and other processed foods made with partially hydrogenated vegetable oils. Small amounts of trans fat also occur naturally in some animal  products, such as milk products, beef, and lamb. Foods high in cholesterol include liver, other organ meats, egg yolks, shrimp, and full-fat dairy products. How can I use the new food label to make heart-healthy food choices? Check the Nutrition Facts panel of the food label. Choose foods lower in saturated fat, trans fat, and cholesterol. For saturated fat and cholesterol, you can also use the Percent Daily Value (%DV): 5% DV or less is low, and 20% DV or more is high. (There is no %DV for trans fat.) Use the Nutrition Facts panel to choose foods low in saturated fat and cholesterol, and if the trans fat is not listed, read the ingredients and limit products that list shortening or hydrogenated or partially hydrogenated vegetable oil, which tend to be high in trans fat. POINTS TO REMEMBER:   Discuss your risk for heart disease with your caregivers, and take steps to reduce risk factors.  Change your diet. Choose foods that are low in saturated fat, trans fat, and cholesterol.  Add exercise to your daily routine if   it is not already being done. Participate in physical activity of moderate intensity, like brisk walking, for at least 30 minutes on most, and preferably all days of the week. No time? Break the 30 minutes into three, 10-minute segments during the day.  Stop smoking. If you do smoke, contact your caregiver to discuss ways in which they can help you quit.  Do not use street drugs.  Maintain a normal weight.  Maintain a healthy blood pressure.  Keep up with your blood work for checking the fats in your blood as directed by your caregiver. Document Released: 12/22/2003 Document Revised: 09/04/2011 Document Reviewed: 07/19/2008 [employer] Patient Information 2014 [employer].  

## 2013-06-18 ENCOUNTER — Telehealth: Payer: Self-pay

## 2013-06-18 NOTE — Telephone Encounter (Signed)
Relevant patient education mailed to patient.  

## 2013-09-02 ENCOUNTER — Other Ambulatory Visit (INDEPENDENT_AMBULATORY_CARE_PROVIDER_SITE_OTHER): Payer: 59

## 2013-09-02 DIAGNOSIS — E119 Type 2 diabetes mellitus without complications: Secondary | ICD-10-CM

## 2013-09-02 DIAGNOSIS — E785 Hyperlipidemia, unspecified: Secondary | ICD-10-CM

## 2013-09-02 DIAGNOSIS — E1165 Type 2 diabetes mellitus with hyperglycemia: Secondary | ICD-10-CM

## 2013-09-02 LAB — LIPID PANEL
Cholesterol: 234 mg/dL — ABNORMAL HIGH (ref 0–200)
HDL: 38.4 mg/dL — ABNORMAL LOW (ref >39.00)
LDL CALC: 121 mg/dL — AB (ref 0–99)
NonHDL: 195.6
Total CHOL/HDL Ratio: 6
Triglycerides: 374 mg/dL — ABNORMAL HIGH (ref 0.0–149.0)
VLDL: 74.8 mg/dL — AB (ref 0.0–40.0)

## 2013-09-02 LAB — HEPATIC FUNCTION PANEL
ALT: 26 U/L (ref 0–53)
AST: 20 U/L (ref 0–37)
Albumin: 4.1 g/dL (ref 3.5–5.2)
Alkaline Phosphatase: 69 U/L (ref 39–117)
Bilirubin, Direct: 0.1 mg/dL (ref 0.0–0.3)
TOTAL PROTEIN: 7 g/dL (ref 6.0–8.3)
Total Bilirubin: 0.7 mg/dL (ref 0.2–1.2)

## 2013-09-02 LAB — HEMOGLOBIN A1C: Hgb A1c MFr Bld: 9.2 % — ABNORMAL HIGH (ref 4.6–6.5)

## 2013-09-08 ENCOUNTER — Encounter: Payer: Self-pay | Admitting: Internal Medicine

## 2013-09-08 ENCOUNTER — Ambulatory Visit (INDEPENDENT_AMBULATORY_CARE_PROVIDER_SITE_OTHER): Payer: 59 | Admitting: Internal Medicine

## 2013-09-08 VITALS — BP 114/82 | HR 84 | Temp 98.1°F | Ht 72.0 in | Wt 236.0 lb

## 2013-09-08 DIAGNOSIS — E1165 Type 2 diabetes mellitus with hyperglycemia: Secondary | ICD-10-CM

## 2013-09-08 DIAGNOSIS — E785 Hyperlipidemia, unspecified: Secondary | ICD-10-CM

## 2013-09-08 DIAGNOSIS — IMO0002 Reserved for concepts with insufficient information to code with codable children: Secondary | ICD-10-CM

## 2013-09-08 DIAGNOSIS — IMO0001 Reserved for inherently not codable concepts without codable children: Secondary | ICD-10-CM

## 2013-09-08 DIAGNOSIS — Z23 Encounter for immunization: Secondary | ICD-10-CM

## 2013-09-08 MED ORDER — ATORVASTATIN CALCIUM 20 MG PO TABS: 20.0000 mg | ORAL_TABLET | Freq: Every day | ORAL | Status: DC

## 2013-09-08 MED ORDER — ASPIRIN EC 81 MG PO TBEC: 81.0000 mg | DELAYED_RELEASE_TABLET | Freq: Every day | ORAL | Status: AC

## 2013-09-08 MED ORDER — METFORMIN HCL 1000 MG PO TABS: 1000.0000 mg | ORAL_TABLET | Freq: Two times a day (BID) | ORAL | Status: DC

## 2013-09-08 NOTE — Progress Notes (Signed)
Pre visit review using our clinic review tool, if applicable. No additional management support is needed unless otherwise documented below in the visit note. 

## 2013-09-08 NOTE — Progress Notes (Signed)
patient comes in for followup of multiple medical problems including type 2 diabetes, hyperlipidemia, hypertension. The patient does not check blood sugar or blood pressure at home. The patetient does not follow an exercise or diet program. The patient denies any polyuria, polydipsia.  In the past the patient has gone to diabetic treatment center. The patient is tolerating medications  Without difficulty. The patient does admit to medication compliance.   Past Medical History  Diagnosis Date  . HYPERLIPIDEMIA 04/03/2007  . HYPERSOMNIA, ASSOCIATED WITH SLEEP APNEA 04/03/2007  . NEPHROLITHIASIS, HX OF 04/03/2007    History   Social History  . Marital Status: Single    Spouse Name: N/A    Number of Children: N/A  . Years of Education: N/A   Occupational History  . Not on file.   Social History Main Topics  . Smoking status: Former Games developermoker  . Smokeless tobacco: Not on file  . Alcohol Use: Yes  . Drug Use: Not on file  . Sexual Activity: Not on file   Other Topics Concern  . Not on file   Social History Narrative  . No narrative on file    Past Surgical History  Procedure Laterality Date  . Back surgery  1981  . Patella reconstruction  1970's    Family History  Problem Relation Age of Onset  . Diabetes Mother   . Heart disease Mother [id number]    CHF  . Pancreatic cancer Father   . Cancer Father   . Heart attack Brother   . Heart disease Brother     No Known Allergies  Current Outpatient Prescriptions on File Prior to Visit  Medication Sig Dispense Refill  . metFORMIN (GLUCOPHAGE) 500 MG tablet Take 1 tablet (500 mg total) by mouth 2 (two) times daily with a meal.  60 tablet  11  . Pitavastatin Calcium (LIVALO) 2 MG TABS Take 1 tablet (2 mg total) by mouth daily.  30 tablet  11   No current facility-administered medications on file prior to visit.     patient denies chest pain, shortness of breath, orthopnea. Denies lower extremity edema, abdominal pain, change in  appetite, change in bowel movements. Patient denies rashes, musculoskeletal complaints. No other specific complaints in a complete review of systems.   BP 114/82  Pulse 84  Temp(Src) 98.1 F (36.7 C) (Oral)  Ht 6' (1.829 m)  Wt 236 lb (107.049 kg)  BMI 32.00 kg/m2  well-developed well-nourished male in no acute distress. HEENT exam atraumatic, normocephalic, neck supple without jugular venous distention. Chest clear to auscultation cardiac exam S1-S2 are regular. Abdominal exam overweight with bowel sounds, soft and nontender. Extremities no edema. Neurologic exam is alert with a normal gait.

## 2013-09-11 NOTE — Assessment & Plan Note (Signed)
Patient has been noncompliant with medications. Will resume metformin. He needs a statin. Will check laboratory work in 3 months. He may need endocrinology consult. Will refer to diabetic treatment Center.

## 2013-09-11 NOTE — Assessment & Plan Note (Signed)
Not controlled. Patient has been noncompliant with medications. We'll start atorvastatin.

## 2013-10-23 ENCOUNTER — Encounter: Payer: 59 | Attending: Internal Medicine | Admitting: *Deleted

## 2013-10-23 VITALS — Ht 73.0 in | Wt 231.6 lb

## 2013-10-23 DIAGNOSIS — E1165 Type 2 diabetes mellitus with hyperglycemia: Secondary | ICD-10-CM

## 2013-10-23 DIAGNOSIS — E119 Type 2 diabetes mellitus without complications: Secondary | ICD-10-CM | POA: Diagnosis present

## 2013-10-23 DIAGNOSIS — IMO0002 Reserved for concepts with insufficient information to code with codable children: Secondary | ICD-10-CM

## 2013-10-23 DIAGNOSIS — Z713 Dietary counseling and surveillance: Secondary | ICD-10-CM | POA: Diagnosis present

## 2013-10-23 NOTE — Patient Instructions (Signed)
Plan:  Aim for 3 Carb Choices per meal (45 grams) +/- 1 either way  Aim for 0-2 Carbs per snack if hungry  Include protein in moderation with your meals and snacks Consider reading food labels for Total Carbohydrate of foods Continue with your activity level daily as tolerated Consider getting Rx from your MD for meter so you can check BG at alternate times per day

## 2013-10-23 NOTE — Progress Notes (Signed)
Appt start time: 0800 end time:  0930.  Assessment:  Patient was seen on  10/23/13 for individual diabetes education. Here with his wife, Learta CoddingSunny, who has Celiac disease. He does most of the cooking, they share the food shopping. Works full time as [employer] [place]. Works variety of shifts 6 - 7 days a week. No meter yet, Will start medication when Rx is filled.   Patient Education Plan per assessed needs and concerns is to attend individual session for Diabetes Self Management Education.  Current HbA1c: 9.2%  Preferred Learning Style:   No preference indicated   Learning Readiness:   Ready  Change in progress  MEDICATIONS: see list  DIETARY INTAKE:  24-hr recall:  B ( AM): sandwich, coffee with 2 tsp sugar  Snk ( AM): occasional regular soda  L ( PM): buys variety of choices, chef's salad OR club sandwich, home fried chips, regular soda or sweet tea Snk ( PM): no D (9 PM): meat, starch, vegetables, OR sushi take out (use pressure cooker a lot) Snk ( PM): no Beverages: coffee, regular soda, water  Usual physical activity: active at work  Estimated energy needs: 1600 calories 180 g carbohydrates 120 g protein 44 g fat  Progress Towards Goal(s):  In progress.   Nutritional Diagnosis:  NB-1.1 Food and nutrition-related knowledge deficit As related to Diabetes.  As evidenced by A1c of 9.2%    Intervention:  Nutrition counseling provided.  Discussed diabetes disease process and treatment options.  Discussed physiology of diabetes and role of obesity on insulin resistance.  Encouraged moderate weight reduction to improve glucose levels.    Provided education on macronutrients on glucose levels.  Provided education on carb counting, importance of regularly scheduled meals/snacks, and meal planning  Discussed effects of physical activity on glucose levels and long-term glucose control.  Recommended he continue his current physical activity/week.  Discussed blood  glucose monitoring and interpretation.  Discussed recommended target ranges and individual ranges.    Described short-term complications: hyper- and hypo-glycemia.  Discussed causes,symptoms, and treatment options.  Discussed prevention, detection, and treatment of long-term complications.  Discussed the role of prolonged elevated glucose levels on body systems.  Discussed role of stress on blood glucose levels and discussed strategies to manage psychosocial issues.  Discussed recommendations for long-term diabetes self-care.  Provided checklist for medical, dental, and emotional self-care.  Plan:  Aim for 3 Carb Choices per meal (45 grams) +/- 1 either way  Aim for 0-2 Carbs per snack if hungry  Include protein in moderation with your meals and snacks Consider reading food labels for Total Carbohydrate of foods Continue with your activity level daily as tolerated Consider getting Rx from your MD for meter so you can check BG at alternate times per day  Teaching Method Utilized: Visual, Auditory and Hands on  Handouts given during visit include: Living Well with Diabetes Carb Counting and Food Label handouts Meal Plan Card  Barriers to learning/adherence to lifestyle change: none  Diabetes self-care support plan:   [facility] support group available  His wife  Demonstrated degree of understanding via:  Teach Back   Monitoring/Evaluation:  Dietary intake, exercise, reading food labels, consider SMBG, and body weight prn.

## 2013-10-29 ENCOUNTER — Encounter: Payer: Self-pay | Admitting: Gastroenterology

## 2013-12-09 ENCOUNTER — Other Ambulatory Visit (INDEPENDENT_AMBULATORY_CARE_PROVIDER_SITE_OTHER): Payer: 59

## 2013-12-09 DIAGNOSIS — IMO0001 Reserved for inherently not codable concepts without codable children: Secondary | ICD-10-CM

## 2013-12-09 DIAGNOSIS — IMO0002 Reserved for concepts with insufficient information to code with codable children: Secondary | ICD-10-CM

## 2013-12-09 DIAGNOSIS — E1165 Type 2 diabetes mellitus with hyperglycemia: Secondary | ICD-10-CM

## 2013-12-09 LAB — LIPID PANEL
Cholesterol: 215 mg/dL — ABNORMAL HIGH (ref 0–200)
HDL: 31.3 mg/dL — ABNORMAL LOW (ref >39.00)
NonHDL: 183.7
TRIGLYCERIDES: 333 mg/dL — AB (ref 0.0–149.0)
Total CHOL/HDL Ratio: 7
VLDL: 66.6 mg/dL — ABNORMAL HIGH (ref 0.0–40.0)

## 2013-12-09 LAB — HEPATIC FUNCTION PANEL
ALBUMIN: 3.9 g/dL (ref 3.5–5.2)
ALK PHOS: 75 U/L (ref 39–117)
ALT: 26 U/L (ref 0–53)
AST: 21 U/L (ref 0–37)
Bilirubin, Direct: 0.1 mg/dL (ref 0.0–0.3)
TOTAL PROTEIN: 7.3 g/dL (ref 6.0–8.3)
Total Bilirubin: 0.7 mg/dL (ref 0.2–1.2)

## 2013-12-09 LAB — HEMOGLOBIN A1C: HEMOGLOBIN A1C: 8.7 % — AB (ref 4.6–6.5)

## 2013-12-09 LAB — LDL CHOLESTEROL, DIRECT: Direct LDL: 130.4 mg/dL

## 2013-12-09 LAB — BASIC METABOLIC PANEL
BUN: 17 mg/dL (ref 6–23)
CO2: 24 mEq/L (ref 19–32)
Calcium: 8.8 mg/dL (ref 8.4–10.5)
Chloride: 105 mEq/L (ref 96–112)
Creatinine, Ser: 1.2 mg/dL (ref 0.4–1.5)
GFR: 64.44 mL/min (ref >60.00)
GLUCOSE: 208 mg/dL — AB (ref 70–99)
POTASSIUM: 4.1 meq/L (ref 3.5–5.1)
SODIUM: 137 meq/L (ref 135–145)

## 2013-12-09 LAB — MICROALBUMIN / CREATININE URINE RATIO
Creatinine,U: 236.4 mg/dL
Microalb Creat Ratio: 0.8 mg/g (ref 0.0–30.0)
Microalb, Ur: 2 mg/dL — ABNORMAL HIGH (ref 0.0–1.9)

## 2013-12-31 ENCOUNTER — Other Ambulatory Visit: Payer: Self-pay | Admitting: Internal Medicine

## 2013-12-31 DIAGNOSIS — IMO0002 Reserved for concepts with insufficient information to code with codable children: Secondary | ICD-10-CM

## 2013-12-31 DIAGNOSIS — E1165 Type 2 diabetes mellitus with hyperglycemia: Secondary | ICD-10-CM

## 2014-01-08 ENCOUNTER — Other Ambulatory Visit: Payer: Self-pay | Admitting: *Deleted

## 2014-01-08 ENCOUNTER — Encounter: Payer: Self-pay | Admitting: Endocrinology

## 2014-01-08 ENCOUNTER — Ambulatory Visit (INDEPENDENT_AMBULATORY_CARE_PROVIDER_SITE_OTHER): Payer: 59 | Admitting: Endocrinology

## 2014-01-08 VITALS — BP 135/85 | HR 69 | Temp 98.6°F | Resp 16 | Ht 72.0 in | Wt 233.8 lb

## 2014-01-08 DIAGNOSIS — E1165 Type 2 diabetes mellitus with hyperglycemia: Secondary | ICD-10-CM

## 2014-01-08 DIAGNOSIS — E785 Hyperlipidemia, unspecified: Secondary | ICD-10-CM

## 2014-01-08 DIAGNOSIS — IMO0002 Reserved for concepts with insufficient information to code with codable children: Secondary | ICD-10-CM

## 2014-01-08 LAB — GLUCOSE, POCT (MANUAL RESULT ENTRY): POC Glucose: 198 mg/dl — AB (ref 70–99)

## 2014-01-08 MED ORDER — ONETOUCH DELICA LANCETS FINE MISC: Status: DC

## 2014-01-08 MED ORDER — GLUCOSE BLOOD VI STRP: ORAL_STRIP | Status: DC

## 2014-01-08 NOTE — Progress Notes (Signed)
Patient ID: Ronnie Ortiz, male   DOB: 1955/12/30, 58 y.o.   MRN: [id number]           Reason for Appointment: Consultation for Type 2 Diabetes  Referring physician: Swords  History of Present Illness:          Diagnosis: Type 2 diabetes mellitus, date of diagnosis: 2011        Past history: He may have had impaired fasting glucose in 2009 and subsequently blood sugars increased gradually His A1c had increased significantly in 3/15 and at that time he was not on any medications He was told to take metformin 1 g twice a day but has been able to tolerate only one tablet today  Recent history:  He is being referred here because of persistently high blood sugars and recent A1c of 8.7 He is seeing the dietitian in 8/15 and he thinks he is watching his diet fairly well usually except for occasional fast food and pizza. Usually avoiding high-fat meals but may be getting more starchy foods with rice He is generally active but has not lost any weight since his consultation with the dietitian He has not started checking his blood sugars He does not complain of unusual fatigue or increased thirst He has not been tried on any other diabetes medications besides metformin Because of continued hyperglycemia he is now referred here for further management       Oral hypoglycemic drugs the patient is taking are:    metformin 1 g at dinner    Side effects from medications have been: Diarrhea from 2 pills Compliance with the medical regimen: Fairly good  Glucose monitoring:  not done        Glucometer:  none             Self-care:  Meals: 3 meals per day. Breakfast is usually a biscuit, lunch is usually a sandwich, dinner his meat and vegetables. Will have popcorn for snacks           Exercise:   walking about 20 minutes almost every day        Dietician visit, most recent: 10/2013.               Weight history: Wt Readings from Last 3 Encounters:  01/08/14 233 lb 12.8 oz (106.051 kg)  10/23/13  231 lb 9.6 oz (105.053 kg)  09/08/13 236 lb (107.049 kg)    Glycemic control:   Lab Results  Component Value Date   HGBA1C 8.7* 12/09/2013   HGBA1C 9.2* 09/02/2013   HGBA1C 9.9* 06/02/2013   Lab Results  Component Value Date   MICROALBUR 2.0* 12/09/2013   LDLCALC 121* 09/02/2013   CREATININE 1.2 12/09/2013         Medication List       This list is accurate as of: 01/08/14  2:11 PM.  Always use your most recent med list.               aspirin EC 81 MG tablet  Take 1 tablet (81 mg total) by mouth daily.     atorvastatin 20 MG tablet  Commonly known as:  LIPITOR  Take 1 tablet (20 mg total) by mouth daily.     metFORMIN 1000 MG tablet  Commonly known as:  GLUCOPHAGE  Take 1 tablet (1,000 mg total) by mouth 2 (two) times daily with a meal.        Allergies: No Known Allergies  Past Medical History  Diagnosis Date  .  HYPERLIPIDEMIA 04/03/2007  . HYPERSOMNIA, ASSOCIATED WITH SLEEP APNEA 04/03/2007  . NEPHROLITHIASIS, HX OF 04/03/2007    Past Surgical History  Procedure Laterality Date  . Back surgery  1981  . Patella reconstruction  1970's    Family History  Problem Relation Age of Onset  . Diabetes Mother   . Heart disease Mother 8581    CHF  . Pancreatic cancer Father   . Cancer Father   . Heart attack Brother   . Heart disease Brother     Social History:  reports that he has quit smoking. He does not have any smokeless tobacco history on file. He reports that he drinks alcohol. His drug history is not on file.    Review of Systems       Vision is normal. Most recent eye exam was        Lipids: Not on Rx now        Lab Results  Component Value Date   CHOL 215* 12/09/2013   HDL 31.30* 12/09/2013   LDLCALC 121* 09/02/2013   LDLDIRECT 130.4 12/09/2013   TRIG 333.0* 12/09/2013   CHOLHDL 7 12/09/2013                  Skin: No rash or infections     Thyroid:  No  unusual fatigue.     The blood pressure has been      No swelling of feet.     No  shortness of breath or chest tightness  on exertion.     Bowel habits: Normal.      No joint  pains.          No history of Numbness, tingling or burning in feet     LABS:  Office Visit on 01/08/2014  Component Date Value Ref Range Status  . POC Glucose 01/08/2014 198* 70 - 99 mg/dl Final    Physical Examination:  BP 137/87  Pulse 69  Temp(Src) 98.6 F (37 C)  Resp 16  Ht 6' (1.829 m)  Wt 233 lb 12.8 oz (106.051 kg)  BMI 31.70 kg/m2  SpO2 96%  GENERAL:         Patient has mild generalized obesity.   HEENT:         Eye exam shows normal external appearance. Fundus exam shows no retinopathy. Oral exam shows normal mucosa .  NECK:         General:  Neck exam shows no lymphadenopathy. Carotids are normal to palpation and no bruit heard.  Thyroid is not enlarged and no nodules felt.   LUNGS:         Chest is symmetrical. Lungs are clear to auscultation.Marland Kitchen.   HEART:         Heart sounds:  S1 and S2 are normal. No murmurs or clicks heard., no S3 or S4.   ABDOMEN:   There is no distention present. Liver and spleen are not palpable. No other mass or tenderness present.  EXTREMITIES:     There is no edema. No skin lesions present.Marland Kitchen.  NEUROLOGICAL:   Vibration sense is  normal in toes. Ankle jerks are absent bilaterally.biceps reflexes are trace.          Diabetic foot exam shows normal monofilament sensation in the toes and plantar surfaces, no skin lesions or ulcers on the feet and normal pedal pulses MUSCULOSKELETAL:       There is no enlargement or deformity of the joints. Spine is normal to inspection..Marland Kitchen  SKIN:       No rash or lesions         ASSESSMENT:  Diabetes type 2, uncontrolled with BMI 32 He has had fairly significant hyperglycemia which has been minimally treated with only 1000 mg of metformin a day Currently he is doing fairly well with his diet and exercise regimen    He is not symptomatic from his hyperglycemia With his A1c of nearly 9% he will need a significantly  potent and on treatment to his metformin Discussed options for treatment with either a  SGLT2 drug or GLP-1 drug. Discussed benefits and mechanism of action of both of these drugs. Have recommended a GLP-1 drug for potentially longer benefits on beta cell function and multiple modes of action  Complications: None evident  Hyperlipidemia: Currently untreated because of side effects with at least 3 statin drugs in the past He may take an aspirin 81 mg a day for preventive benefits  Increased blood pressure: We will need to continue following this, may improve with weight loss and use of GLP-1 drug  PLAN:   He will start Trulicity or Tanzeum whichever is covered by his insurance once a week. Discussed mode of injection, how to do the injection and possible side effects. If he starts Trulicity he will take to 0.75 mg weekly for the first 4 weeks  Showed him how to use a One Touch Verio meter and he will check blood sugars about once a day alternating fasting and postprandial readings  Continue increased physical activity and moderating portions and diet  Followup in 4 weeks  Consider Livalo for hyperlipidemia, will discuss on his next visit unless he is going to see his PCP in the meantime  Eye exams at least every 2 years  Counseling time over 50% of today's 45 minute visit    Abrina Petz 01/08/2014, 2:11 PM   Note: This office note was prepared with Insurance underwriterDragon voice recognition system technology. Any transcriptional errors that result from this process are unintentional.

## 2014-01-08 NOTE — Patient Instructions (Signed)
Please check blood sugars at least half the time about 2 hours after any meal and twice a week on waking up. Please bring blood sugar monitor to each visit  Trulicity or Tanzeum: Take once a week. May have mild nausea in the first few days Try to keep portions small to avoid nausea

## 2014-01-18 ENCOUNTER — Telehealth: Payer: Self-pay | Admitting: Endocrinology

## 2014-01-18 ENCOUNTER — Other Ambulatory Visit: Payer: Self-pay | Admitting: *Deleted

## 2014-01-18 MED ORDER — DULAGLUTIDE 0.75 MG/0.5ML ~~LOC~~ SOAJ: SUBCUTANEOUS | Status: DC

## 2014-01-18 NOTE — Telephone Encounter (Signed)
Please see below and advise.

## 2014-01-18 NOTE — Telephone Encounter (Signed)
Pt has an appt at 11/4 however he has not been able to start the med Dr. Lucianne MussKumar wanted him to be on. Should be still come?

## 2014-01-18 NOTE — Telephone Encounter (Signed)
He can cancel the appointment but need to figure out whether he is able to get Trulicity, Tanzeum or Victoza.  May need to be trained on the pen injector.  Reschedule for about 3 weeks

## 2014-01-20 ENCOUNTER — Ambulatory Visit: Payer: 59 | Admitting: Endocrinology

## 2014-01-20 ENCOUNTER — Other Ambulatory Visit: Payer: 59

## 2014-02-16 ENCOUNTER — Encounter: Payer: Self-pay | Admitting: Endocrinology

## 2014-02-16 ENCOUNTER — Ambulatory Visit (INDEPENDENT_AMBULATORY_CARE_PROVIDER_SITE_OTHER): Payer: 59 | Admitting: Endocrinology

## 2014-02-16 ENCOUNTER — Other Ambulatory Visit: Payer: Self-pay | Admitting: *Deleted

## 2014-02-16 VITALS — BP 140/92 | HR 78 | Temp 98.0°F | Resp 16 | Ht 72.0 in | Wt 230.0 lb

## 2014-02-16 DIAGNOSIS — E1165 Type 2 diabetes mellitus with hyperglycemia: Secondary | ICD-10-CM

## 2014-02-16 DIAGNOSIS — IMO0002 Reserved for concepts with insufficient information to code with codable children: Secondary | ICD-10-CM

## 2014-02-16 MED ORDER — DULAGLUTIDE 1.5 MG/0.5ML ~~LOC~~ SOAJ: SUBCUTANEOUS | Status: DC

## 2014-02-16 MED ORDER — METFORMIN HCL ER 750 MG PO TB24: ORAL_TABLET | ORAL | Status: DC

## 2014-02-16 NOTE — Progress Notes (Signed)
Patient ID: Ronnie Ortiz, male   DOB: [birth year], 58 y.o.   MRN: [id number]           Reason for Appointment: Follow-up for Type 2 Diabetes  Referring physician: Swords  History of Present Illness:          Diagnosis: Type 2 diabetes mellitus, date of diagnosis: 2011        Past history: He may have had impaired fasting glucose in 2009 and subsequently blood sugars increased gradually His A1c had increased significantly in 3/15 and at that time he was not on any medications He was told to take metformin 1 g twice a day but has been able to tolerate only one tablet today  Recent history:  He was referred here because of persistently high blood sugars and  A1c of 8.7 This was despite having had a dietitian discussed meal planning with him in August He is generally active but had not lost any weight since his consultation with the dietitian However he appears to be having relatively high fat meal at breakfast He was started on home glucose monitoring which he had not been doing Also he was started on Trulicity 0.75 mg weekly in addition to his 1000 mg metformin He has checked a few blood sugars and they appear to be gradually improving, checking blood sugars mostly late at night, no recent fasting readings He finds that he is having better portion control with Trulicity and no nausea Has only occasional heartburn with this       Oral hypoglycemic drugs the patient is taking are:    metformin 1 g at dinner    Side effects from medications have been: Diarrhea from 2 pills Compliance with the medical regimen: Fairly good  Glucose monitoring:  0.4 times a day Glucometer:  One Touch  POST-MEAL PC Breakfast PC Lunch PC Dinner  Glucose range:  172-226   196   158-258   Mean/median:  175    200    Overall median 175           Self-care:  Meals: 3 meals per day. Breakfast is usually a meat and eggs sandwich, lunch is usually a sandwich, dinner his meat and vegetables. Will have  popcorn for snacks           Exercise:   walking about 20 minutes almost every day        Dietician visit, most recent: 10/2013.               Weight history: Wt Readings from Last 3 Encounters:  02/16/14 230 lb (104.327 kg)  01/08/14 233 lb 12.8 oz (106.051 kg)  10/23/13 231 lb 9.6 oz (105.053 kg)    Glycemic control:   Lab Results  Component Value Date   HGBA1C 8.7* 12/09/2013   HGBA1C 9.2* 09/02/2013   HGBA1C 9.9* 06/02/2013   Lab Results  Component Value Date   MICROALBUR 2.0* 12/09/2013   LDLCALC 121* 09/02/2013   CREATININE 1.2 12/09/2013         Medication List       This list is accurate as of: 02/16/14  8:32 AM.  Always use your most recent med list.               aspirin EC 81 MG tablet  Take 1 tablet (81 mg total) by mouth daily.     Dulaglutide 0.75 MG/0.5ML Sopn  Commonly known as:  TRULICITY  Inject the contents of one per per week  glucose blood test strip  Commonly known as:  ONETOUCH VERIO  Use as instructed to check blood sugar once a day     metFORMIN 1000 MG tablet  Commonly known as:  GLUCOPHAGE  Take 1 tablet (1,000 mg total) by mouth 2 (two) times daily with a meal.     ONETOUCH DELICA LANCETS FINE Misc  Use to check blood sugar once daily     pseudoephedrine 30 MG tablet  Commonly known as:  SUDAFED  Take 30 mg by mouth every 4 (four) hours as needed for congestion.        Allergies:  Allergies  Allergen Reactions  . Lipitor [Atorvastatin]     Arthralgia    Past Medical History  Diagnosis Date  . HYPERLIPIDEMIA 04/03/2007  . HYPERSOMNIA, ASSOCIATED WITH SLEEP APNEA 04/03/2007  . NEPHROLITHIASIS, HX OF 04/03/2007    Past Surgical History  Procedure Laterality Date  . Back surgery  1981  . Patella reconstruction  1970's    Family History  Problem Relation Age of Onset  . Diabetes Mother   . Heart disease Mother 4381    CHF  . Pancreatic cancer Father   . Cancer Father   . Heart attack Brother   . Heart  disease Brother     Social History:  reports that he has quit smoking. He does not have any smokeless tobacco history on file. He reports that he drinks alcohol. His drug history is not on file.    Review of Systems         Lipids: Currently untreated because of side effects with at least 3 statin drugs in the past       Lab Results  Component Value Date   CHOL 215* 12/09/2013   HDL 31.30* 12/09/2013   LDLCALC 121* 09/02/2013   LDLDIRECT 130.4 12/09/2013   TRIG 333.0* 12/09/2013   CHOLHDL 7 12/09/2013               He has not been on antihypertensive medications  He does need follow-up eye exam  LABS:  No visits with results within 1 Week(s) from this visit. Latest known visit with results is:  Office Visit on 01/08/2014  Component Date Value Ref Range Status  . POC Glucose 01/08/2014 198* 70 - 99 mg/dl Final    Physical Examination:  BP 140/92 mmHg  Pulse 78  Temp(Src) 98 F (36.7 C)  Resp 16  Ht 6' (1.829 m)  Wt 230 lb (104.327 kg)  BMI 31.19 kg/m2  SpO2 96%        ASSESSMENT:  Diabetes type 2, uncontrolled with BMI 32 He has had some improvement in his blood sugars with starting Trulicity although some of his postprandial readings are still over 200 He is tolerating this fairly well with only mild heartburn as side effect Currently only on 1000 mg of metformin a day and has not been able to tolerate higher doses He can do a little better with his diet especially reducing caloric intake at breakfast  PLAN:   He will start increase Trulicity to 1.5 mg and he can take OTC Zantac or Prilosec if he is having more heartburn  Change metformin to metformin ER 750 mg, 1 tablet twice a day  Check blood sugars at various times of the day and call if persistently high  Reduce fat intake at breakfast, need to eliminate high-fat meats    Ronnie Ortiz 02/16/2014, 8:32 AM   Note: This office note was prepared with Reubin Milanragon  voice recognition system technology.  Any transcriptional errors that result from this process are unintentional.

## 2014-02-16 NOTE — Patient Instructions (Addendum)
Increase Trulicity to 1.5mg  weekly  Some readings on waking up

## 2014-04-05 ENCOUNTER — Other Ambulatory Visit (INDEPENDENT_AMBULATORY_CARE_PROVIDER_SITE_OTHER): Payer: 59

## 2014-04-05 DIAGNOSIS — IMO0002 Reserved for concepts with insufficient information to code with codable children: Secondary | ICD-10-CM

## 2014-04-05 DIAGNOSIS — E1165 Type 2 diabetes mellitus with hyperglycemia: Secondary | ICD-10-CM

## 2014-04-05 LAB — COMPREHENSIVE METABOLIC PANEL
ALBUMIN: 4.2 g/dL (ref 3.5–5.2)
ALK PHOS: 64 U/L (ref 39–117)
ALT: 23 U/L (ref 0–53)
AST: 17 U/L (ref 0–37)
BUN: 22 mg/dL (ref 6–23)
CALCIUM: 9.3 mg/dL (ref 8.4–10.5)
CO2: 26 mEq/L (ref 19–32)
CREATININE: 1.22 mg/dL (ref 0.40–1.50)
Chloride: 104 mEq/L (ref 96–112)
GFR: 64.98 mL/min (ref >60.00)
GLUCOSE: 139 mg/dL — AB (ref 70–99)
POTASSIUM: 4.9 meq/L (ref 3.5–5.1)
Sodium: 138 mEq/L (ref 135–145)
TOTAL PROTEIN: 7.2 g/dL (ref 6.0–8.3)
Total Bilirubin: 0.6 mg/dL (ref 0.2–1.2)

## 2014-04-05 LAB — HEMOGLOBIN A1C: HEMOGLOBIN A1C: 6.9 % — AB (ref 4.6–6.5)

## 2014-04-08 ENCOUNTER — Encounter: Payer: Self-pay | Admitting: Endocrinology

## 2014-04-08 ENCOUNTER — Ambulatory Visit (INDEPENDENT_AMBULATORY_CARE_PROVIDER_SITE_OTHER): Payer: 59 | Admitting: Endocrinology

## 2014-04-08 VITALS — BP 124/74 | HR 70 | Temp 97.9°F | Ht 72.0 in | Wt 230.0 lb

## 2014-04-08 DIAGNOSIS — E1165 Type 2 diabetes mellitus with hyperglycemia: Secondary | ICD-10-CM

## 2014-04-08 DIAGNOSIS — IMO0002 Reserved for concepts with insufficient information to code with codable children: Secondary | ICD-10-CM

## 2014-04-08 DIAGNOSIS — E785 Hyperlipidemia, unspecified: Secondary | ICD-10-CM

## 2014-04-08 NOTE — Progress Notes (Signed)
Patient ID: Ronnie Ortiz, male   DOB: [birth year], 59 y.o.   MRN: [id number]           Reason for Appointment: Follow-up for Type 2 Diabetes  Referring physician: Swords  History of Present Illness:          Diagnosis: Type 2 diabetes mellitus, date of diagnosis: 2011        Past history: He may have had impaired fasting glucose in 2009 and subsequently blood sugars increased gradually His A1c had increased significantly in 3/15 and at that time he was not on any medications He was told to take metformin 1 g twice a day but has been able to tolerate only one tablet today He was referred here in 12/2013 because of persistently high blood sugars and  A1c of 8.7  Recent history:  He was started on home glucose monitoring which he had not been doing Also he was started on Trulicity 0.75 mg weekly in addition to his 1000 mg metformin Although he was having some heartburn and gaseousness with this her symptoms are minor and his dose was increased to 1.5 mg in 12/15 because of inadequate control With this his blood sugars have improved further although he is checking readings mostly around bedtime; previously has median blood sugar was 175. Also his metformin was changed to extended release and the dose increased to 1500 mg a day for better tolerability His A1c has improved and is below 7% now He is tolerating Trulicity and occasionally will take a Gas-X for dyspepsia He is generally active but has not lost any weight even after  his consultation with the dietitian  Oral hypoglycemic drugs the patient is taking are:    metformin 750 mg ER about 2 tablets daily    Side effects from medications have been: Diarrhea from high dose Metformin Compliance with the medical regimen: Fairly good  Glucose monitoring:  0.4 times a day Glucometer:  One Touch  PRE-MEAL Breakfast Lunch Dinner Bedtime Overall  Glucose range:  136-141   128    145-186    Median  138     156   151                Self-care:  Meals: 3 meals per day. Breakfast is usually a meat and eggs sandwich, lunch is usually a sandwich, dinner his meat and vegetables. Will have popcorn for snacks           Exercise:   walking about 20 minutes almost every day        Dietician visit, most recent: 10/2013.               Weight history:  Wt Readings from Last 3 Encounters:  04/08/14 230 lb (104.327 kg)  02/16/14 230 lb (104.327 kg)  01/08/14 233 lb 12.8 oz (106.051 kg)    Glycemic control:   Lab Results  Component Value Date   HGBA1C 6.9* 04/05/2014   HGBA1C 8.7* 12/09/2013   HGBA1C 9.2* 09/02/2013   Lab Results  Component Value Date   MICROALBUR 2.0* 12/09/2013   LDLCALC 121* 09/02/2013   CREATININE 1.22 04/05/2014         Medication List       This list is accurate as of: 04/08/14  9:00 AM.  Always use your most recent med list.               aspirin EC 81 MG tablet  Take 1 tablet (81 mg total)  by mouth daily.     Dulaglutide 1.5 MG/0.5ML Sopn  Commonly known as:  TRULICITY  Inject one pen per week     glucose blood test strip  Commonly known as:  ONETOUCH VERIO  Use as instructed to check blood sugar once a day     metFORMIN 750 MG 24 hr tablet  Commonly known as:  GLUCOPHAGE XR  Take 2 tablets per day     ONETOUCH DELICA LANCETS FINE Misc  Use to check blood sugar once daily     pseudoephedrine 30 MG tablet  Commonly known as:  SUDAFED  Take 30 mg by mouth every 4 (four) hours as needed for congestion.        Allergies:  Allergies  Allergen Reactions  . Lipitor [Atorvastatin]     Arthralgia    Past Medical History  Diagnosis Date  . HYPERLIPIDEMIA 04/03/2007  . HYPERSOMNIA, ASSOCIATED WITH SLEEP APNEA 04/03/2007  . NEPHROLITHIASIS, HX OF 04/03/2007    Past Surgical History  Procedure Laterality Date  . Back surgery  1981  . Patella reconstruction  1970's    Family History  Problem Relation Age of Onset  . Diabetes Mother   . Heart disease Mother 71     CHF  . Pancreatic cancer Father   . Cancer Father   . Heart attack Brother   . Heart disease Brother     Social History:  reports that he has quit smoking. He does not have any smokeless tobacco history on file. He reports that he drinks alcohol. His drug history is not on file.    Review of Systems         Lipids: Currently untreated because of side effects with at least 3 statin drugs in the past, He has tried Livalo also which caused diarrhea       Lab Results  Component Value Date   CHOL 215* 12/09/2013   HDL 31.30* 12/09/2013   LDLCALC 121* 09/02/2013   LDLDIRECT 130.4 12/09/2013   TRIG 333.0* 12/09/2013   CHOLHDL 7 12/09/2013               He has not been on antihypertensive medications    LABS:  Appointment on 04/05/2014  Component Date Value Ref Range Status  . Hgb A1c MFr Bld 04/05/2014 6.9* 4.6 - 6.5 % Final   Glycemic Control Guidelines for People with Diabetes:Non Diabetic:  <6%Goal of Therapy: <7%Additional Action Suggested:  >8%   . Sodium 04/05/2014 138  135 - 145 mEq/L Final  . Potassium 04/05/2014 4.9  3.5 - 5.1 mEq/L Final  . Chloride 04/05/2014 104  96 - 112 mEq/L Final  . CO2 04/05/2014 26  19 - 32 mEq/L Final  . Glucose, Bld 04/05/2014 139* 70 - 99 mg/dL Final  . BUN 16/12/9602 22  6 - 23 mg/dL Final  . Creatinine, Ser 04/05/2014 1.22  0.40 - 1.50 mg/dL Final  . Total Bilirubin 04/05/2014 0.6  0.2 - 1.2 mg/dL Final  . Alkaline Phosphatase 04/05/2014 64  39 - 117 U/L Final  . AST 04/05/2014 17  0 - 37 U/L Final  . ALT 04/05/2014 23  0 - 53 U/L Final  . Total Protein 04/05/2014 7.2  6.0 - 8.3 g/dL Final  . Albumin 54/11/8117 4.2  3.5 - 5.2 g/dL Final  . Calcium 14/78/2956 9.3  8.4 - 10.5 mg/dL Final  . GFR 21/30/8657 64.98  >60.00 mL/min Final    Physical Examination:  BP 124/74 mmHg  Pulse  70  Temp(Src) 97.9 F (36.6 C) (Oral)  Ht 6' (1.829 m)  Wt 230 lb (104.327 kg)  BMI 31.19 kg/m2  SpO2 96%     ASSESSMENT:  Diabetes type 2,  uncontrolled with BMI 32 He has had further improvement in his blood sugars with  1.5 mg Trulicity Previously he had postprandial readings over 200 and now they are averaging about 160  Also fasting readings are relatively better A1c is significantly better at 6.9 He can do a little better with consistent exercise and diet as he has not lost any weight Also with increasing his metformin to 1500 mg a day using the extended release he is tolerating this better  Hyperlipidemia: As above  PLAN:   He will  continue the same regimen for now  Discussed consistent compliance with diet and exercise for weight loss  Follow-up in 3 months again  Will also need to reassess his lipids, consider fenofibrate if triglycerides are still high    Ronnie Ortiz 04/08/2014, 9:00 AM   Note: This office note was prepared with Insurance underwriter. Any transcriptional errors that result from this process are unintentional.

## 2014-06-19 ENCOUNTER — Inpatient Hospital Stay
Admit: 2014-06-19 | Disposition: A | Discharge status: Home / Self Care | Payer: Self-pay | Attending: Surgery | Admitting: Surgery

## 2014-06-19 LAB — URINALYSIS, COMPLETE
BACTERIA: NONE SEEN
Bilirubin,UR: NEGATIVE
Blood: NEGATIVE
LEUKOCYTE ESTERASE: NEGATIVE
Nitrite: NEGATIVE
Ph: 6 (ref 4.5–8.0)
Protein: 30
RBC,UR: <1 /HPF (ref 0–5)
SPECIFIC GRAVITY: 1.022 (ref 1.003–1.030)
Squamous Epithelial: NONE SEEN

## 2014-06-19 LAB — COMPREHENSIVE METABOLIC PANEL
AST: 25 U/L
Albumin: 4.4 g/dL
Alkaline Phosphatase: 77 U/L
Anion Gap: 9 (ref 7–16)
BUN: 13 mg/dL
Bilirubin,Total: 1.2 mg/dL
CALCIUM: 9 mg/dL
CHLORIDE: 102 mmol/L
CO2: 23 mmol/L
Creatinine: 1.06 mg/dL
EGFR (African American): >60
EGFR (Non-African Amer.): >60
Glucose: 212 mg/dL — ABNORMAL HIGH
POTASSIUM: 3.8 mmol/L
SGPT (ALT): 22 U/L
Sodium: 134 mmol/L — ABNORMAL LOW
Total Protein: 8.3 g/dL — ABNORMAL HIGH

## 2014-06-19 LAB — CBC WITH DIFFERENTIAL/PLATELET
Basophil #: 0 10*3/uL (ref 0.0–0.1)
Basophil %: 0.1 %
EOS ABS: 0 10*3/uL (ref 0.0–0.7)
Eosinophil %: 0.2 %
HCT: 49.2 % (ref 40.0–52.0)
HGB: 16.5 g/dL (ref 13.0–18.0)
LYMPHS PCT: 7.4 %
Lymphocyte #: 1.4 10*3/uL (ref 1.0–3.6)
MCH: 30.6 pg (ref 26.0–34.0)
MCHC: 33.6 g/dL (ref 32.0–36.0)
MCV: 91 fL (ref 80–100)
Monocyte #: 1.4 x10 3/mm — ABNORMAL HIGH (ref 0.2–1.0)
Monocyte %: 7.3 %
Neutrophil #: 16.2 10*3/uL — ABNORMAL HIGH (ref 1.4–6.5)
Neutrophil %: 85 %
Platelet: 217 10*3/uL (ref 150–440)
RBC: 5.41 10*6/uL (ref 4.40–5.90)
RDW: 13.4 % (ref 11.5–14.5)
WBC: 19 10*3/uL — ABNORMAL HIGH (ref 3.8–10.6)

## 2014-06-19 LAB — LIPASE, BLOOD: LIPASE: 31 U/L

## 2014-06-19 LAB — TROPONIN I

## 2014-06-19 IMAGING — US ABDOMEN ULTRASOUND LIMITED
1 series · 14 of 25 positions shown · non-contrast
Comparison: None.

CLINICAL DATA: Right upper quadrant pain

EXAM:
US ABDOMEN LIMITED - RIGHT UPPER QUADRANT

[Series 1: abdomen ultrasound limited · 0.23mm/px · 14 of 59 slices shown]
[im 1/59]
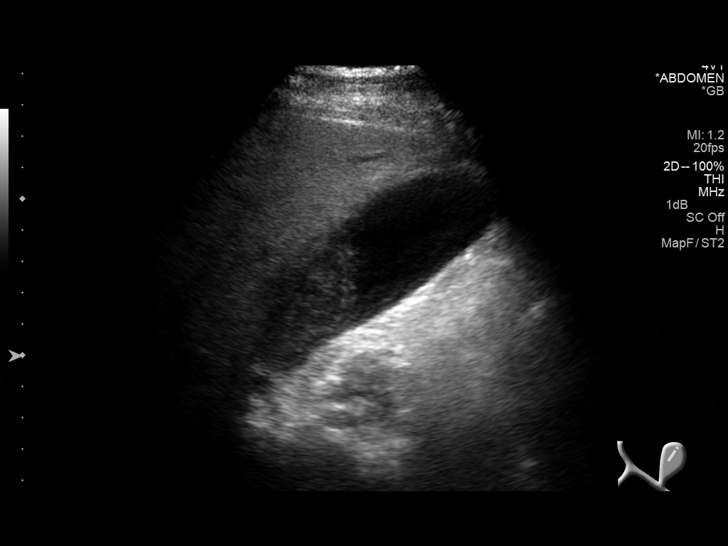
[im 5/59]
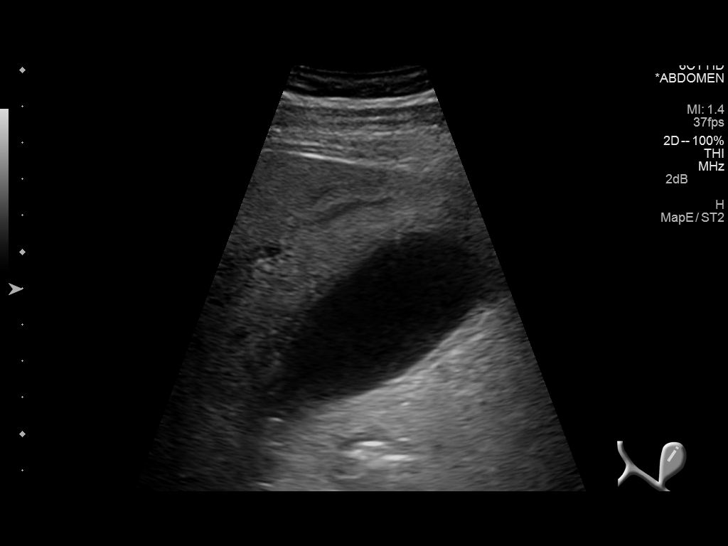
[im 10/59]
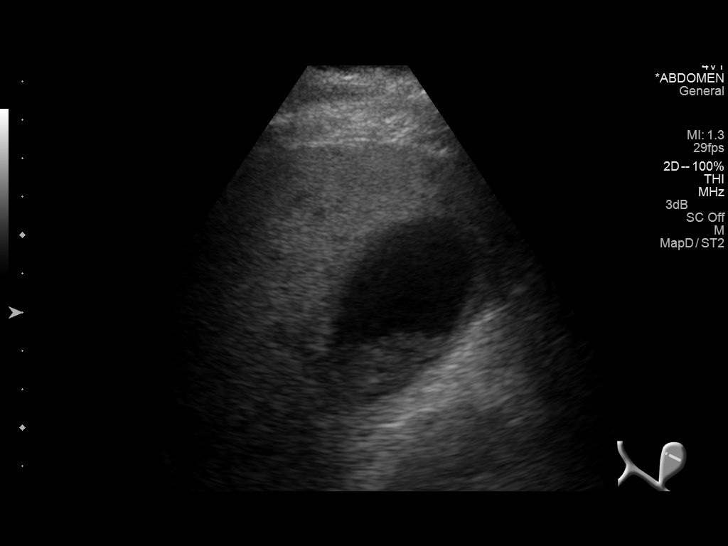
[im 15/59]
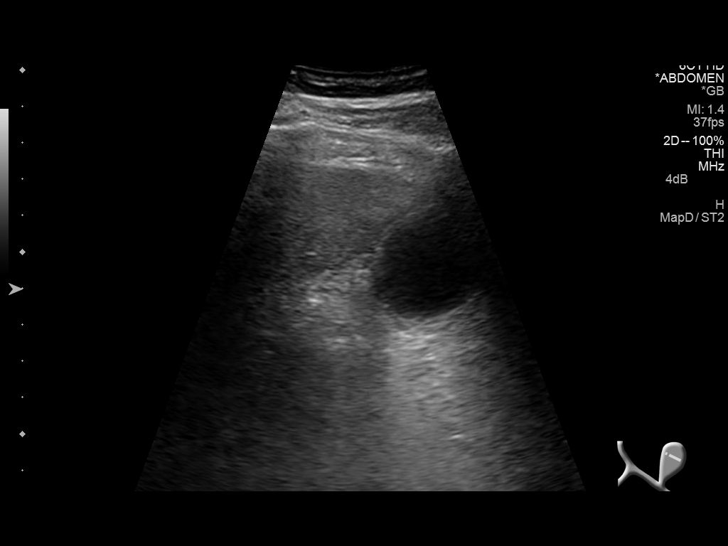
[im 20/59]
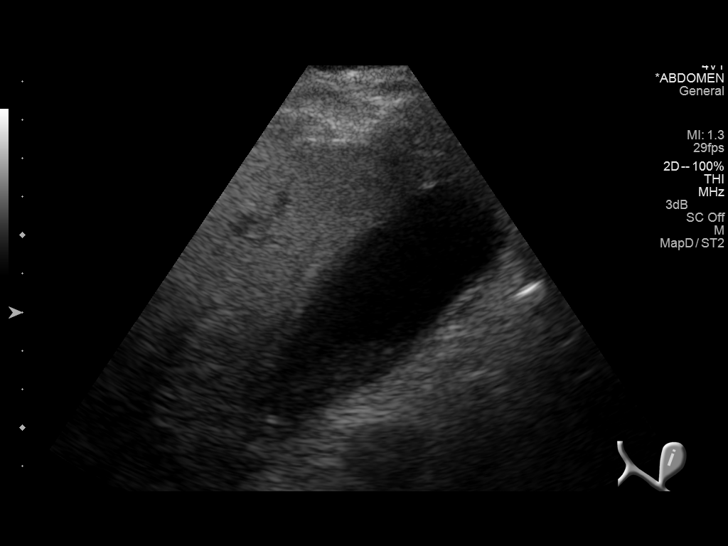
[im 22/59]
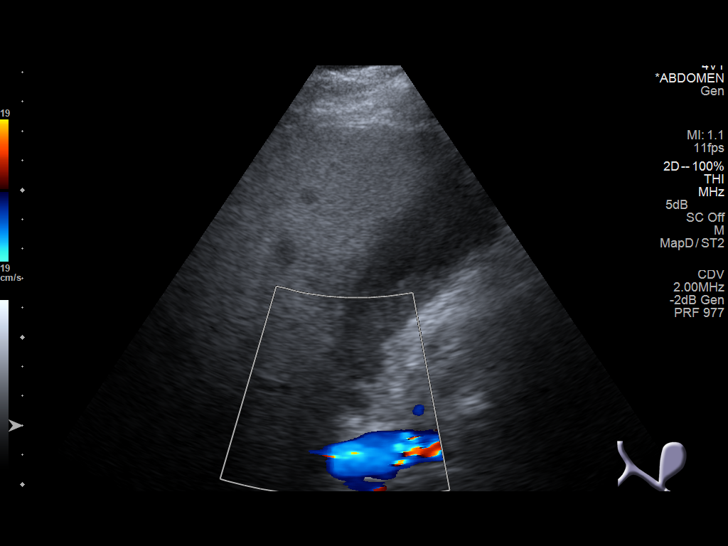
[im 27/59]
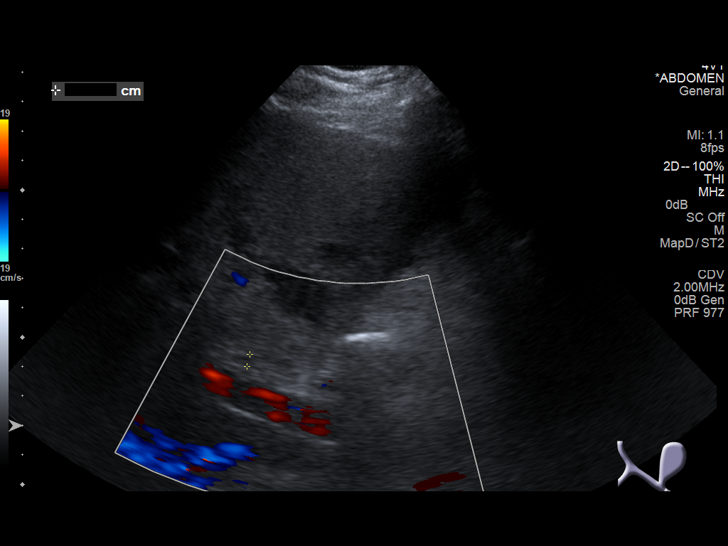
[im 32/59]
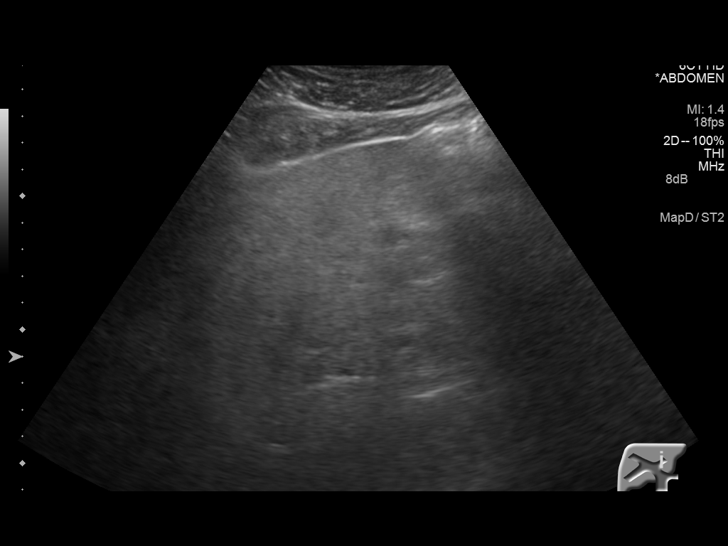
[im 37/59]
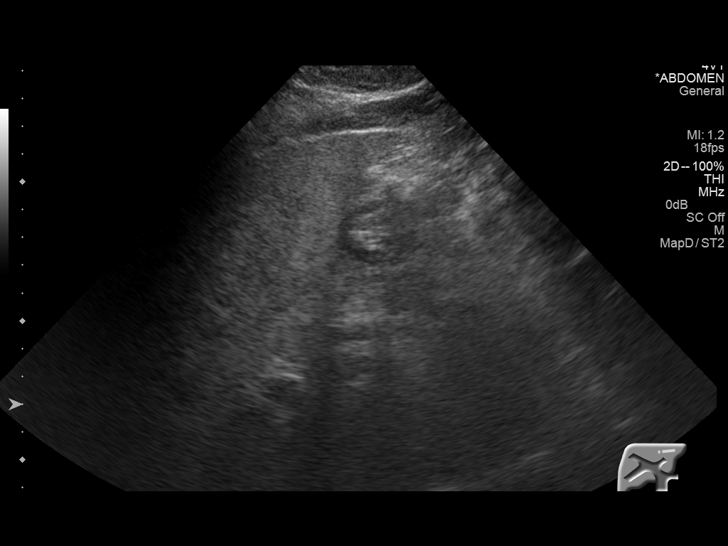
[im 39/59]
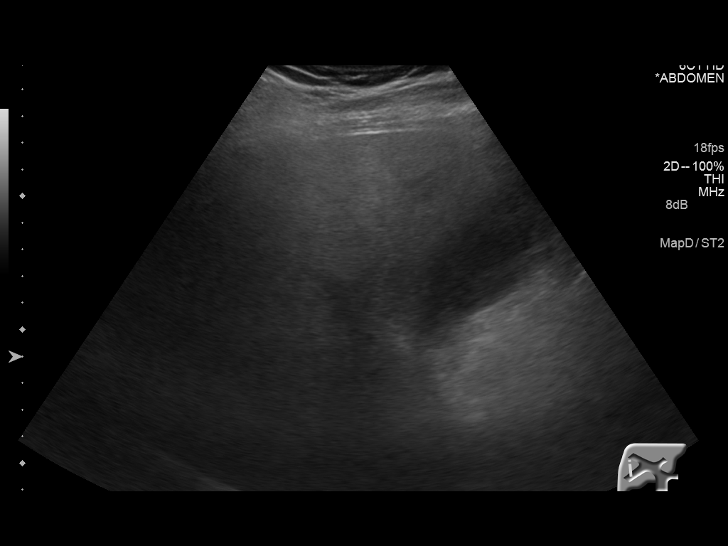
[im 44/59]
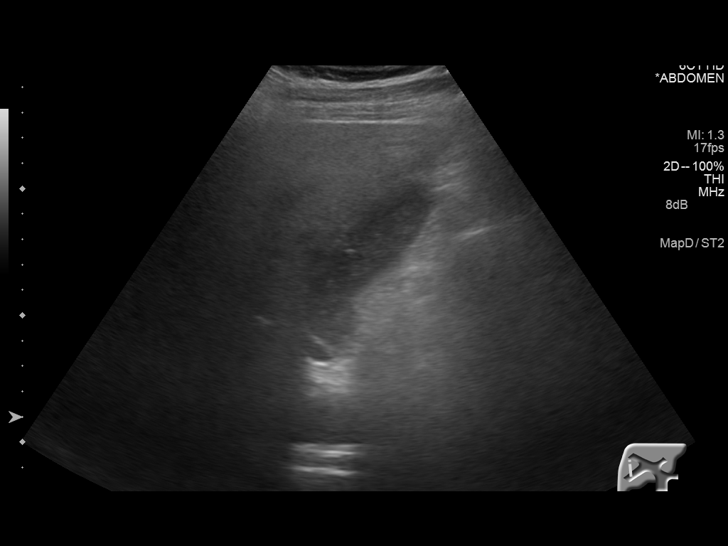
[im 49/59]
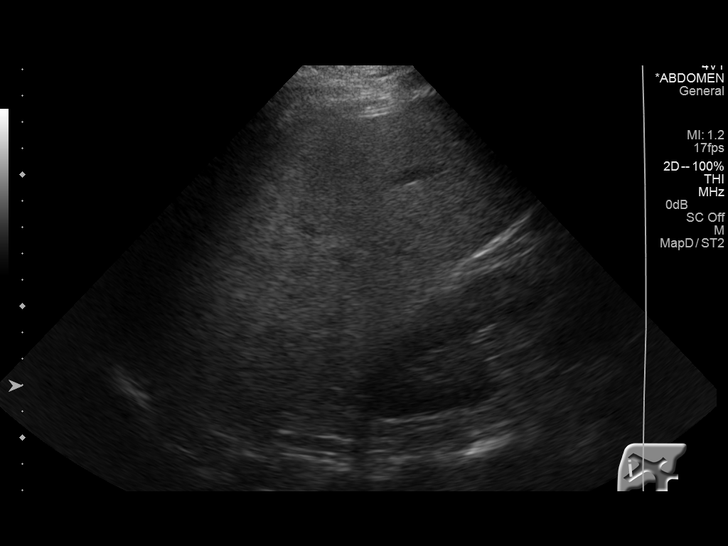
[im 54/59]
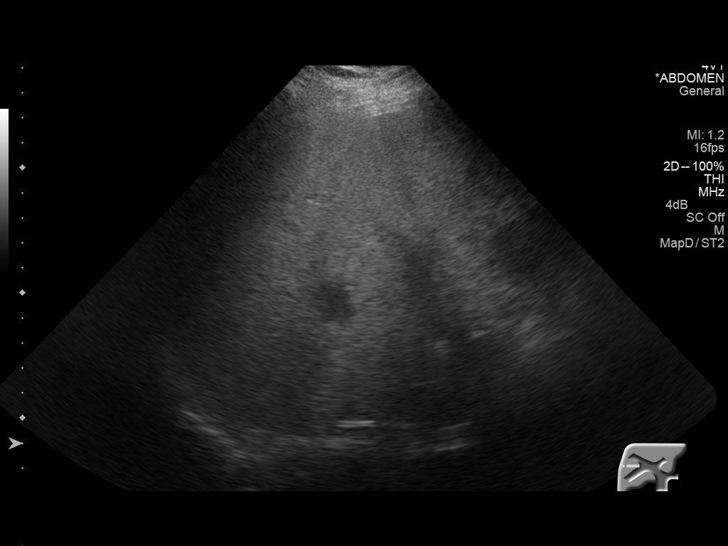
[im 59/59]
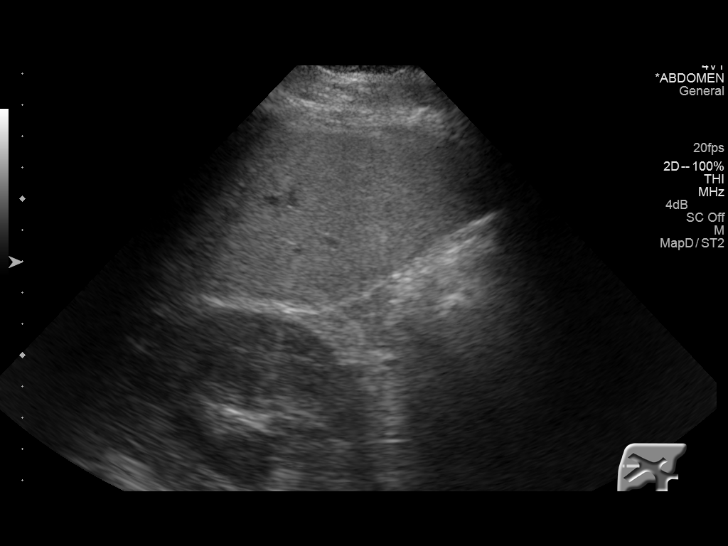

[14 of 25 positions shown; findings below may reference images not displayed]

FINDINGS: Gallbladder:

Well distended with echogenic material within. This is most
consistent with gallbladder sludge. There is a tiny area of
pericholecystic fluid identified. Positive sonographic Murphy sign
is noted.

Common bile duct:

Diameter: 4.5 mm.

Liver:

Increased in echogenicity consistent with fatty infiltration.
IMPRESSION: Fatty liver.

Gallbladder sludge. Positive sonographic Murphy sign and minimal
pericholecystic fluid.

## 2014-06-20 LAB — COMPREHENSIVE METABOLIC PANEL
ALBUMIN: 3.5 g/dL
ALT: 43 U/L
Alkaline Phosphatase: 68 U/L
Anion Gap: 6 — ABNORMAL LOW (ref 7–16)
BUN: 11 mg/dL
Bilirubin,Total: 1.6 mg/dL — ABNORMAL HIGH
CREATININE: 0.99 mg/dL
Calcium, Total: 8.4 mg/dL — ABNORMAL LOW
Chloride: 102 mmol/L
Co2: 25 mmol/L
EGFR (African American): >60
EGFR (Non-African Amer.): >60
Glucose: 178 mg/dL — ABNORMAL HIGH
Potassium: 3.8 mmol/L
SGOT(AST): 27 U/L
Sodium: 133 mmol/L — ABNORMAL LOW
TOTAL PROTEIN: 6.9 g/dL

## 2014-06-20 LAB — CBC WITH DIFFERENTIAL/PLATELET
BASOS ABS: 0 10*3/uL (ref 0.0–0.1)
Basophil %: 0.2 %
Eosinophil #: 0 10*3/uL (ref 0.0–0.7)
Eosinophil %: 0.1 %
HCT: 42.6 % (ref 40.0–52.0)
HGB: 14.3 g/dL (ref 13.0–18.0)
LYMPHS PCT: 5.1 %
Lymphocyte #: 1 10*3/uL (ref 1.0–3.6)
MCH: 30.5 pg (ref 26.0–34.0)
MCHC: 33.5 g/dL (ref 32.0–36.0)
MCV: 91 fL (ref 80–100)
MONOS PCT: 9.5 %
Monocyte #: 1.8 x10 3/mm — ABNORMAL HIGH (ref 0.2–1.0)
NEUTROS PCT: 85.1 %
Neutrophil #: 16.2 10*3/uL — ABNORMAL HIGH (ref 1.4–6.5)
Platelet: 170 10*3/uL (ref 150–440)
RBC: 4.68 10*6/uL (ref 4.40–5.90)
RDW: 13.3 % (ref 11.5–14.5)
WBC: 19.1 10*3/uL — AB (ref 3.8–10.6)

## 2014-06-20 LAB — LIPASE, BLOOD: LIPASE: 47 U/L

## 2014-06-20 LAB — AMYLASE: Amylase: 26 U/L — ABNORMAL LOW

## 2014-06-23 ENCOUNTER — Other Ambulatory Visit: Payer: Self-pay | Admitting: Endocrinology

## 2014-07-05 ENCOUNTER — Other Ambulatory Visit: Payer: 59

## 2014-07-08 ENCOUNTER — Telehealth: Payer: Self-pay | Admitting: Endocrinology

## 2014-07-08 ENCOUNTER — Encounter: Payer: Self-pay | Admitting: *Deleted

## 2014-07-08 ENCOUNTER — Ambulatory Visit: Payer: 59 | Admitting: Endocrinology

## 2014-07-08 NOTE — Telephone Encounter (Signed)
Letter mailed

## 2014-07-08 NOTE — Telephone Encounter (Signed)
Patient no showed today's appt. Please advise on how to follow up. °A. No follow up necessary. °B. Follow up urgent. Contact patient immediately. °C. Follow up necessary. Contact patient and schedule visit in ___ days. °D. Follow up advised. Contact patient and schedule visit in ____weeks. ° °

## 2014-07-12 LAB — SURGICAL PATHOLOGY

## 2014-07-12 NOTE — Telephone Encounter (Signed)
LM for pt to call and reschedule labs and fu

## 2014-07-18 NOTE — Op Note (Signed)
PATIENT NAME:  Ronnie Ortiz, Ronnie Ortiz MR#:  [id number] DATE OF BIRTH:  03-03-1956  DATE OF PROCEDURE:  06/20/2014  OPERATION PERFORMED:  Laparoscopic cholecystectomy.   PREOPERATIVE DIAGNOSIS:  Acute cholecystitis.   POSTOPERATIVE DIAGNOSIS:  Acute gangrenous cholecystitis.   SURGEON:  Claude MangesWilliam F. Lakota Schweppe, MD   ANESTHESIA:  General.   PROCEDURE IN DETAIL:  The patient was placed supine on the operating room table and prepped and draped in the usual sterile fashion. A 15 mmHg CO2 pneumoperitoneum was created via a Veress needle in the infraumbilical position, and this was replaced with a 5 mm trocar and a 30-degree angled laparoscope. The gallbladder was covered with omentum, and when I uncovered it, it was obvious that it was gangrenous, almost completely. Indeed, even the liver surface was almost completely dead. It had not perforated. The gallbladder was decompressed of some thick green bile. The fundus was grasped and retracted superiorly and ventrally, and the infundibulum was dissected out and retracted laterally to open up the triangle of Calot. There was a significant dense adhesive matter within the triangle, but I was able to ascertain the cystic artery, which I doubly clipped and then dissected out the cystic duct. This was only dissected out for a short distance and it was quite thick, and therefore I doubly clipped it with Hem-o-lok clips and divided it. The gallbladder was then removed from the liver bed with the electrocautery, placed in an Endo Catch bag, and extracted from the abdomen via the epigastric port. This port site fascia was then closed with a 0 Vicryl suture using the laparoscopic puncture closure device, and the right upper quadrant was irrigated with copious amounts of warm normal saline and this was suctioned clear including from the pelvis. Hemostasis was excellent. The clips were secure, and therefore the peritoneum was desufflated and decannulated and all 4 skin sites were  closed with subcuticular 5-0 Monocryl and suture strips. The patient tolerated the procedure well. There were no complications.    ____________________________ Claude MangesWilliam F. Jamielee Mchale, MD wfm:nb D: 06/20/2014 13:04:37 ET T: 06/21/2014 03:41:34 ET JOB#: [id number]  cc: Claude MangesWilliam F. Roshard Rezabek, MD, <Dictator> Claude MangesWILLIAM F Makaelah Cranfield MD ELECTRONICALLY SIGNED 06/28/2014 21:44

## 2014-07-18 NOTE — Discharge Summary (Signed)
PATIENT NAME:  Ronnie SeminoleRICHARDSON, Ahmere C MR#:  [id number] DATE OF BIRTH:  [birth year]  DATE OF ADMISSION:  06/19/2014 DATE OF DISCHARGE:  06/23/2014  PRINCIPAL DIAGNOSIS:  Acute gangrenous cholecystitis.  OTHER DIAGNOSIS:  Type 2 diabetes mellitus.   PRINCIPAL PROCEDURE PERFORMED DURING THIS ADMISSION:  Laparoscopic cholecystectomy.   HOSPITAL COURSE: The patient was admitted to the hospital, given IV antibiotics and taken to the operating room the following day, where he underwent the above-mentioned procedure for the above-mentioned diagnosis.   On postoperative day 1, he actually felt worse with more pain than he did preoperatively and the following day he felt better but was still in considerable pain and by postoperative day 3, he was tolerating a diet and feeling much better. His incisions were clean and he was discharged home.   He was asked to follow up in my office in 10 days and to call in the interim for any problems.   ____________________________ Claude MangesWilliam F. Estalene Bergey, MD [phone] D: 07/01/2014 06:02:00 ET T: 07/01/2014 08:12:07 ET JOB#: [id number]  cc: Claude MangesWilliam F. Sharla Tankard, MD, <Dictator> Claude MangesWILLIAM F Cezar Misiaszek MD ELECTRONICALLY SIGNED 07/01/2014 21:08

## 2014-07-18 NOTE — H&P (Signed)
History of Present Illness [id number] who was awoken with exquisite RUQ pain at 3AM. He hasn't had much nausea, but he has vomited multiple times and he has had a Sx fever (with sweats). No previous episodes. No change in his skin, eye, or urine color.   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[employer]), no one else at home   Place of Living Home   Review of Systems:  Fever/Chills Yes   Cough No   Sputum No   Abdominal Pain Yes   Diarrhea No   Constipation No   Nausea/Vomiting Yes   SOB/DOE No   Chest Pain No   Dysuria No   Tolerating PT Yes   Tolerating Diet No  Nauseated  Vomiting  no anorexia   Physical Exam:  GEN well developed, well nourished, no acute distress   HEENT pink conjunctivae, PERRL, hearing intact to voice, moist oral mucosa, Oropharynx clear   NECK supple  No masses  trachea midline   RESP normal resp effort  clear BS  no use of accessory muscles   CARD regular rate  no murmur  no thrills  no JVD  no Rub   ABD positive tenderness  tender RUQ with rebound and guarding   LYMPH negative neck   EXTR negative cyanosis/clubbing, negative edema   SKIN normal to palpation, No ulcers, skin turgor good   NEURO cranial nerves intact, negative tremor, follows commands, motor/sensory function intact   PSYCH alert, A+O to time, place, person, good insight   Lab Results: Hepatic:  02-Apr-16 10:15   Bilirubin, Total 1.2 (0.3-1.2 NOTE: New Reference Range  05/25/14)   Alkaline Phosphatase 77 (38-126 NOTE: New Reference Range  05/25/14)  SGPT (ALT) 22 (17-63 NOTE: New Reference Range  05/25/14)  SGOT (AST) 25 (15-41 NOTE: New Reference Range  05/25/14)  Total Protein, Serum  8.3 (6.5-8.1 NOTE: New Reference Range  05/25/14)  Albumin, Serum 4.4 (3.5-5.0 NOTE: New reference range  05/25/14)  Routine Chem:  02-Apr-16 10:15   Glucose, Serum  212 (65-99 NOTE: New Reference Range  05/25/14)  BUN 13 (6-20 NOTE: New Reference Range  05/25/14)  Creatinine (comp) 1.06 (0.61-1.24 NOTE: New Reference Range  05/25/14)  Sodium, Serum  134 (135-145 NOTE: New Reference Range  05/25/14)  Potassium, Serum 3.8 (3.5-5.1 NOTE: New Reference Range  05/25/14)  Chloride, Serum 102 (101-111 NOTE: New Reference Range  05/25/14)  CO2, Serum 23 (22-32 NOTE: New Reference Range  05/25/14)  Calcium (Total), Serum 9.0 (8.9-10.3 NOTE: New Reference Range  05/25/14)  eGFR (African American) >60  eGFR (Non-African American) >60 (eGFR values <21mL/min/1.73 m2 may be an indication of chronic kidney disease (CKD). Calculated eGFR is useful in patients with stable renal function. The eGFR calculation will not be reliable in acutely ill patients when serum creatinine is changing rapidly. It is not useful in patients on dialysis. The eGFR calculation may not be applicable to patients at the low and high extremes of body sizes, pregnant women, and vegetarians.)  Anion Gap  9  Lipase 31 (22-51 NOTE: New Reference Range  05/25/14)  Cardiac:  02-Apr-16 10:15   Troponin I <0.03 (0.00-0.03 0.03 ng/mL or less: NEGATIVE  Repeat testing in 3-6 hrs  if clinically indicated. >0.05 ng/mL: POTENTIAL  MYOCARDIAL INJURY. Repeat  testing in 3-6 hrs if  clinically indicated. NOTE: An increase or decrease  of 30% or more on serial  testing suggests a  clinically important change NOTE: New Reference Range  05/25/14)  Routine UA:  02-Apr-16 14:25   Color (UA) Yellow   Clarity (UA) Clear  Glucose (UA) >=500  Bilirubin (UA) Negative  Ketones (UA) Trace  Specific Gravity (UA) 1.022  Blood (UA) Negative  pH (UA) 6.0  Protein (UA) 30 mg/dL  Nitrite (UA) Negative  Leukocyte Esterase (UA) Negative (Result(s) reported on 19 Jun 2014 at 02:41PM.)  RBC (UA) <1 /HPF  WBC (UA) 1 /HPF  Bacteria (UA) NONE SEEN  Epithelial Cells (UA) NONE SEEN  Mucous (UA) PRESENT  Hyaline Cast (UA) 2 /LPF (Result(s) reported on 19 Jun 2014 at 02:41PM.)  Routine Hem:  02-Apr-16 10:15   WBC (CBC)  19.0  RBC (CBC) 5.41  Hemoglobin (CBC) 16.5  Hematocrit (CBC) 49.2  Platelet Count (CBC) 217  MCV 91  MCH 30.6  MCHC 33.6  RDW 13.4  Neutrophil % 85.0  Lymphocyte % 7.4  Monocyte % 7.3  Eosinophil % 0.2  Basophil % 0.1  Neutrophil #  16.2  Lymphocyte # 1.4  Monocyte #  1.4  Eosinophil # 0.0  Basophil # 0.0 (Result(s) reported on 19 Jun 2014 at 12:23PM.)   Radiology Results: [place]:    02-Apr-16 13:16, US Abdomen Limited Survey  US Abdomen Limited Survey  REASON FOR EXAM:    RUQ PAIN  COMMENTS:   Body Site: Gallbladder, Liver, Common Bile Duct    PROCEDURE: [place]  - US ABDOMEN LIMITED SURVEY  - Jun 19 2014  1:16PM     CLINICAL DATA:  Right upper quadrant pain    EXAM:  US ABDOMEN LIMITED - RIGHT UPPER QUADRANT    COMPARISON:  None.    FINDINGS:  Gallbladder:  Well distended with echogenic material within. This is most  consistent with gallbladder sludge. There is a tiny area of  pericholecystic fluid identified. Positive sonographic Percell Miller sign  is noted.    Common bile duct:    Diameter: 4.5 mm.    Liver:    Increased in echogenicity consistent with fatty infiltration.     IMPRESSION:  Fatty liver.  Gallbladder sludge. Positive sonographic Murphy sign and minimal  pericholecystic fluid.      Electronically Signed    By: Inez Catalina M.D.    On: 06/19/2014 14:02         Verified By: Everlene Farrier, M.D.,  LabUnknown:  PACS Image     Assessment/Admission Diagnosis Acute cholecystitis   Plan Admit, IVF, IV ABx, clear liquids until MN, analgesics, anti-emetics Lap CCY tomorrow. Pt understands the 1/200 risk of CBD injury, and agrees   Electronic Signatures: Consuela Mimes (MD)  (Signed 02-Apr-16 15:44)  Authored: CHIEF COMPLAINT and HISTORY, PAST MEDICAL/SURGIAL HISTORY, ALLERGIES, HOME MEDICATIONS, FAMILY AND SOCIAL HISTORY, REVIEW OF SYSTEMS, PHYSICAL EXAM, LABS, Radiology, ASSESSMENT AND PLAN   Last Updated: 02-Apr-16 15:44 by Consuela Mimes (MD)

## 2014-08-04 ENCOUNTER — Other Ambulatory Visit: Payer: Self-pay | Admitting: Endocrinology

## 2014-09-06 ENCOUNTER — Other Ambulatory Visit: Payer: Self-pay

## 2014-09-09 ENCOUNTER — Telehealth: Payer: Self-pay | Admitting: Endocrinology

## 2014-09-09 ENCOUNTER — Ambulatory Visit: Payer: Self-pay | Admitting: Endocrinology

## 2014-09-09 NOTE — Telephone Encounter (Signed)
Patient no showed today's appt. Please advise on how to follow up. °A. No follow up necessary. °B. Follow up urgent. Contact patient immediately. °C. Follow up necessary. Contact patient and schedule visit in ___ days. °D. Follow up advised. Contact patient and schedule visit in ____weeks. ° °

## 2014-09-22 ENCOUNTER — Other Ambulatory Visit (INDEPENDENT_AMBULATORY_CARE_PROVIDER_SITE_OTHER): Payer: Self-pay

## 2014-09-22 DIAGNOSIS — E1165 Type 2 diabetes mellitus with hyperglycemia: Secondary | ICD-10-CM

## 2014-09-22 DIAGNOSIS — IMO0002 Reserved for concepts with insufficient information to code with codable children: Secondary | ICD-10-CM

## 2014-09-22 LAB — COMPREHENSIVE METABOLIC PANEL
ALT: 19 U/L (ref 0–53)
AST: 15 U/L (ref 0–37)
Albumin: 3.8 g/dL (ref 3.5–5.2)
Alkaline Phosphatase: 66 U/L (ref 39–117)
BUN: 19 mg/dL (ref 6–23)
CALCIUM: 8.9 mg/dL (ref 8.4–10.5)
CO2: 27 meq/L (ref 19–32)
Chloride: 104 mEq/L (ref 96–112)
Creatinine, Ser: 1.06 mg/dL (ref 0.40–1.50)
GFR: 76.3 mL/min (ref >60.00)
Glucose, Bld: 161 mg/dL — ABNORMAL HIGH (ref 70–99)
Potassium: 4.2 mEq/L (ref 3.5–5.1)
Sodium: 138 mEq/L (ref 135–145)
Total Bilirubin: 0.6 mg/dL (ref 0.2–1.2)
Total Protein: 7.3 g/dL (ref 6.0–8.3)

## 2014-09-22 LAB — HEMOGLOBIN A1C: Hgb A1c MFr Bld: 6.7 % — ABNORMAL HIGH (ref 4.6–6.5)

## 2014-09-22 NOTE — Telephone Encounter (Signed)
Please reschedule at earliest appointment

## 2014-09-28 ENCOUNTER — Encounter: Payer: Self-pay | Admitting: Endocrinology

## 2014-09-28 ENCOUNTER — Other Ambulatory Visit: Payer: Self-pay | Admitting: *Deleted

## 2014-09-28 ENCOUNTER — Ambulatory Visit (INDEPENDENT_AMBULATORY_CARE_PROVIDER_SITE_OTHER): Payer: 59 | Admitting: Endocrinology

## 2014-09-28 VITALS — BP 120/70 | HR 75 | Temp 98.1°F | Resp 16 | Ht 72.0 in | Wt 230.4 lb

## 2014-09-28 DIAGNOSIS — E1165 Type 2 diabetes mellitus with hyperglycemia: Secondary | ICD-10-CM

## 2014-09-28 DIAGNOSIS — E785 Hyperlipidemia, unspecified: Secondary | ICD-10-CM

## 2014-09-28 DIAGNOSIS — IMO0002 Reserved for concepts with insufficient information to code with codable children: Secondary | ICD-10-CM

## 2014-09-28 MED ORDER — DULAGLUTIDE 0.75 MG/0.5ML ~~LOC~~ SOAJ: SUBCUTANEOUS | Status: DC

## 2014-09-28 MED ORDER — DULAGLUTIDE 1.5 MG/0.5ML ~~LOC~~ SOAJ: SUBCUTANEOUS | Status: DC

## 2014-09-28 NOTE — Patient Instructions (Addendum)
Trulicity 0.75mg  for 4 weeks and then 1.5mg .  Check blood sugars on waking up .. 2 .. times a week Also check blood sugars about 2 hours after a meal and do this after different meals by rotation  Recommended blood sugar levels on waking up is 90-130 and about 2 hours after meal is 140-180 Please bring blood sugar monitor to each visit.  Get eye exam

## 2014-09-28 NOTE — Progress Notes (Signed)
Patient ID: Ronnie Ortiz, male   DOB: 02-23-1956, 59 y.o.   MRN: [id number]           Reason for Appointment: Follow-up for Type 2 Diabetes  Referring physician: [facility]  History of Present Illness:          Diagnosis: Type 2 diabetes mellitus, date of diagnosis: 2011        Past history: He may have had impaired fasting glucose in 2009 and subsequently blood sugars increased gradually His A1c had increased significantly in 3/15 and at that time he was not on any medications He was told to take metformin 1 g twice a day but has been able to tolerate only one tablet today He was referred here in 12/2013 because of persistently high blood sugars and  A1c of 8.7  Recent history:  On his initial consultation he was started on Trulicity  weekly in addition to his 1000 mg metformin Also his metformin was changed to extended release and the dose increased to 1500 mg a day for better tolerability  His A1c had come down to 6.9 on his last visit but he did not follow-up since his visit in 1/16 Also he ran out of his Trulicity in March and is now coming back for follow-up As discussed below his blood sugars are being monitored only in the morning usually and he does not remember most of his readings His fasting blood sugar was 161 and his postprandial reading in the office today is over 200  He has not been as active recently because of recovering from a complicated gallbladder surgery  Oral hypoglycemic drugs the patient is taking are:    metformin 750 mg ER about 2 tablets daily    Side effects from medications have been: Diarrhea from high dose Metformin Compliance with the medical regimen: Fairly good  Glucose monitoring:  0.4 times a day Glucometer:  One Touch  Readings by recall:  PRE-MEAL Fasting Lunch Dinner Bedtime Overall  Glucose range: 148-160   ?   Mean/median:                  Self-care:  Meals: 3 meals per day. Breakfast is usually a meat and eggs sandwich, lunch is  usually a sandwich, dinner his meat and vegetables.  Will have popcorn for snacks           Exercise:   walking about 20 minutes at times     Dietician visit, most recent: 10/2013.               Weight history:  Wt Readings from Last 3 Encounters:  09/28/14 230 lb 6.4 oz (104.509 kg)  04/08/14 230 lb (104.327 kg)  02/16/14 230 lb (104.327 kg)    Glycemic control:   Lab Results  Component Value Date   HGBA1C 6.7* 09/22/2014   HGBA1C 6.9* 04/05/2014   HGBA1C 8.7* 12/09/2013   Lab Results  Component Value Date   MICROALBUR 2.0* 12/09/2013   LDLCALC 121* 09/02/2013   CREATININE 1.06 09/22/2014         Medication List       This list is accurate as of: 09/28/14  3:10 PM.  Always use your most recent med list.               aspirin EC 81 MG tablet  Take 1 tablet (81 mg total) by mouth daily.     Dulaglutide 0.75 MG/0.5ML Sopn  Commonly known as:  TRULICITY  Inject  contents of one pen weekly     Dulaglutide 1.5 MG/0.5ML Sopn  Commonly known as:  TRULICITY  INJECT CONTENTS OF ONE PEN ONCE WEEKLY, START TAKING THIS DOSAGE AFTER YOU HAVE FINISHED THE 0.75MG      glucose blood test strip  Commonly known as:  ONETOUCH VERIO  Use as instructed to check blood sugar once a day     metFORMIN 750 MG 24 hr tablet  Commonly known as:  GLUCOPHAGE XR  Take 2 tablets per day     ONETOUCH DELICA LANCETS FINE Misc  Use to check blood sugar once daily     pseudoephedrine 30 MG tablet  Commonly known as:  SUDAFED  Take 30 mg by mouth every 4 (four) hours as needed for congestion.        Allergies:  Allergies  Allergen Reactions  . Lipitor [Atorvastatin]     Arthralgia    Past Medical History  Diagnosis Date  . HYPERLIPIDEMIA 04/03/2007  . HYPERSOMNIA, ASSOCIATED WITH SLEEP APNEA 04/03/2007  . NEPHROLITHIASIS, HX OF 04/03/2007    Past Surgical History  Procedure Laterality Date  . Back surgery  1981  . Patella reconstruction  1970's    Family History  Problem  Relation Age of Onset  . Diabetes Mother   . Heart disease Mother 8    CHF  . Pancreatic cancer Father   . Cancer Father   . Heart attack Brother   . Heart disease Brother     Social History:  reports that he has quit smoking. He does not have any smokeless tobacco history on file. He reports that he drinks alcohol. His drug history is not on file.    Review of Systems         Lipids: Currently untreated because of side effects with at least 3 statin drugs in the past, He has tried Livalo also which caused diarrhea Has not had any recent labs       Lab Results  Component Value Date   CHOL 215* 12/09/2013   HDL 31.30* 12/09/2013   LDLCALC 121* 09/02/2013   LDLDIRECT 130.4 12/09/2013   TRIG 333.0* 12/09/2013   CHOLHDL 7 12/09/2013               He has not been on antihypertensive medications  Since his cholecystectomy he will get diarrhea with high fat meals   LABS:  Lab on 09/22/2014  Component Date Value Ref Range Status  . Hgb A1c MFr Bld 09/22/2014 6.7* 4.6 - 6.5 % Final   Glycemic Control Guidelines for People with Diabetes:Non Diabetic:  <6%Goal of Therapy: <7%Additional Action Suggested:  >8%   . Sodium 09/22/2014 138  135 - 145 mEq/L Final  . Potassium 09/22/2014 4.2  3.5 - 5.1 mEq/L Final  . Chloride 09/22/2014 104  96 - 112 mEq/L Final  . CO2 09/22/2014 27  19 - 32 mEq/L Final  . Glucose, Bld 09/22/2014 161* 70 - 99 mg/dL Final  . BUN 78/29/5621 19  6 - 23 mg/dL Final  . Creatinine, Ser 09/22/2014 1.06  0.40 - 1.50 mg/dL Final  . Total Bilirubin 09/22/2014 0.6  0.2 - 1.2 mg/dL Final  . Alkaline Phosphatase 09/22/2014 66  39 - 117 U/L Final  . AST 09/22/2014 15  0 - 37 U/L Final  . ALT 09/22/2014 19  0 - 53 U/L Final  . Total Protein 09/22/2014 7.3  6.0 - 8.3 g/dL Final  . Albumin 30/86/5784 3.8  3.5 - 5.2 g/dL Final  .  Calcium 09/22/2014 8.9  8.4 - 10.5 mg/dL Final  . GFR 16/12/9602 76.30  >60.00 mL/min Final    Physical Examination:  BP 120/70  mmHg  Pulse 75  Temp(Src) 98.1 F (36.7 C)  Resp 16  Ht 6' (1.829 m)  Wt 230 lb 6.4 oz (104.509 kg)  BMI 31.24 kg/m2  SpO2 97%     ASSESSMENT:  Diabetes type 2,  with BMI 31 He has had some worsening of his blood sugars with his running out of his Trulicity Previously A1c had been below 7% and it is still fairly good recently However blood sugars are higher and not controlled with metformin alone He has not been able to exercise as much because of cholecystectomy and still not recovered completely from this  Hyperlipidemia: Needs follow-up  PLAN:   He will resume his Trulicity but start with 0.75 mg to avoid any GI side effects, he has occasional diarrhea still  Continue metformin 1500 mg  More blood sugars after meals  He will try to increase his exercise as tolerated  Consistent low-fat diet which he is trying to do better now  Follow-up in 3 months again  Will also need to reassess his lipids, consider fenofibrate if triglycerides are still high   Patient Instructions  Trulicity 0.75mg  for 4 weeks and then 1.5mg .  Check blood sugars on waking up .. 2 .. times a week Also check blood sugars about 2 hours after a meal and do this after different meals by rotation  Recommended blood sugar levels on waking up is 90-130 and about 2 hours after meal is 140-180 Please bring blood sugar monitor to each visit.  Get eye exam     Tanajah Boulter 09/28/2014, 3:10 PM   Note: This office note was prepared with Dragon voice recognition system technology. Any transcriptional errors that result from this process are unintentional.

## 2014-12-24 ENCOUNTER — Other Ambulatory Visit: Payer: 59

## 2014-12-29 ENCOUNTER — Encounter: Payer: Self-pay | Admitting: *Deleted

## 2014-12-29 ENCOUNTER — Telehealth: Payer: Self-pay | Admitting: Endocrinology

## 2014-12-29 ENCOUNTER — Ambulatory Visit: Payer: 59 | Admitting: Endocrinology

## 2014-12-29 DIAGNOSIS — Z0289 Encounter for other administrative examinations: Secondary | ICD-10-CM

## 2014-12-29 NOTE — Telephone Encounter (Signed)
Patient no showed today's appt. Please advise on how to follow up. °A. No follow up necessary. °B. Follow up urgent. Contact patient immediately. °C. Follow up necessary. Contact patient and schedule visit in ___ days. °D. Follow up advised. Contact patient and schedule visit in ____weeks. ° °

## 2014-12-29 NOTE — Telephone Encounter (Signed)
Letter mailed, this is patients 4th no show in a row.

## 2015-01-03 NOTE — Telephone Encounter (Signed)
Needs to be rescheduled at first available appointment, if no contact made will consider dismissal

## 2015-01-05 NOTE — Telephone Encounter (Signed)
LM for pt to reschedule appt.

## 2015-07-22 ENCOUNTER — Telehealth: Payer: Self-pay | Admitting: Endocrinology

## 2015-07-22 MED ORDER — METFORMIN HCL ER 750 MG PO TB24: ORAL_TABLET | ORAL | Status: DC

## 2015-07-22 NOTE — Telephone Encounter (Signed)
PT [place] [facility] [place]

## 2015-07-22 NOTE — Telephone Encounter (Signed)
Rx submitted for a 30 day supply.  

## 2015-09-13 ENCOUNTER — Other Ambulatory Visit: Payer: Self-pay | Admitting: Endocrinology

## 2015-11-11 ENCOUNTER — Other Ambulatory Visit: Payer: Self-pay | Admitting: Endocrinology

## 2015-12-14 ENCOUNTER — Other Ambulatory Visit: Payer: Self-pay | Admitting: Endocrinology

## 2015-12-26 ENCOUNTER — Other Ambulatory Visit: Payer: Self-pay | Admitting: Nurse Practitioner

## 2015-12-26 ENCOUNTER — Ambulatory Visit
Admission: RE | Admit: 2015-12-26 | Discharge: 2015-12-26 | Disposition: A | Discharge status: Home / Self Care | Payer: Worker's Compensation | Source: Ambulatory Visit | Attending: Nurse Practitioner | Admitting: Nurse Practitioner

## 2015-12-26 DIAGNOSIS — M25511 Pain in right shoulder: Secondary | ICD-10-CM

## 2016-01-11 ENCOUNTER — Other Ambulatory Visit: Payer: Self-pay | Admitting: Endocrinology

## 2016-04-02 ENCOUNTER — Other Ambulatory Visit: Payer: Self-pay | Admitting: Endocrinology

## 2017-06-05 ENCOUNTER — Ambulatory Visit: Payer: Self-pay | Admitting: Endocrinology

## 2017-07-01 ENCOUNTER — Ambulatory Visit (INDEPENDENT_AMBULATORY_CARE_PROVIDER_SITE_OTHER): Payer: 59 | Admitting: Endocrinology

## 2017-07-01 ENCOUNTER — Encounter: Payer: Self-pay | Admitting: Endocrinology

## 2017-07-01 VITALS — BP 122/78 | HR 63 | Ht 72.0 in | Wt 201.8 lb

## 2017-07-01 DIAGNOSIS — E1165 Type 2 diabetes mellitus with hyperglycemia: Secondary | ICD-10-CM | POA: Diagnosis not present

## 2017-07-01 DIAGNOSIS — R634 Abnormal weight loss: Secondary | ICD-10-CM | POA: Diagnosis not present

## 2017-07-01 DIAGNOSIS — E782 Mixed hyperlipidemia: Secondary | ICD-10-CM

## 2017-07-01 LAB — COMPREHENSIVE METABOLIC PANEL
ALT: 14 U/L (ref 0–53)
AST: 11 U/L (ref 0–37)
Albumin: 4 g/dL (ref 3.5–5.2)
Alkaline Phosphatase: 77 U/L (ref 39–117)
BILIRUBIN TOTAL: 0.8 mg/dL (ref 0.2–1.2)
BUN: 16 mg/dL (ref 6–23)
CALCIUM: 9.1 mg/dL (ref 8.4–10.5)
CO2: 28 meq/L (ref 19–32)
CREATININE: 0.97 mg/dL (ref 0.40–1.50)
Chloride: 101 mEq/L (ref 96–112)
GFR: 83.72 mL/min (ref >60.00)
GLUCOSE: 242 mg/dL — AB (ref 70–99)
Potassium: 4.7 mEq/L (ref 3.5–5.1)
Sodium: 136 mEq/L (ref 135–145)
Total Protein: 7.1 g/dL (ref 6.0–8.3)

## 2017-07-01 LAB — MICROALBUMIN / CREATININE URINE RATIO
CREATININE, U: 184.3 mg/dL
Microalb Creat Ratio: 0.6 mg/g (ref 0.0–30.0)
Microalb, Ur: 1.1 mg/dL (ref 0.0–1.9)

## 2017-07-01 LAB — GLUCOSE, POCT (MANUAL RESULT ENTRY): POC Glucose: 257 mg/dl — AB (ref 70–99)

## 2017-07-01 LAB — POCT GLYCOSYLATED HEMOGLOBIN (HGB A1C): Hemoglobin A1C: 11.1

## 2017-07-01 MED ORDER — GLIMEPIRIDE 2 MG PO TABS: 2.0000 mg | ORAL_TABLET | Freq: Every day | ORAL | 1 refills | Status: DC

## 2017-07-01 MED ORDER — DULAGLUTIDE 0.75 MG/0.5ML ~~LOC~~ SOAJ: SUBCUTANEOUS | 0 refills | Status: DC

## 2017-07-01 MED ORDER — GLUCOSE BLOOD VI STRP: ORAL_STRIP | 1 refills | Status: DC

## 2017-07-01 MED ORDER — METFORMIN HCL ER 750 MG PO TB24
1500.0000 mg | ORAL_TABLET | Freq: Every day | ORAL | 0 refills | Status: DC
Start: 2017-07-01 — End: 2017-07-29

## 2017-07-01 NOTE — Patient Instructions (Signed)
Check blood sugars on waking up  2-3/7  Also check blood sugars about 2 hours after a meal and do this after different meals by rotation  Recommended blood sugar levels on waking up is 90-130 and about 2 hours after meal is 130-160  Please bring your blood sugar monitor to each visit, thank you   

## 2017-07-01 NOTE — Progress Notes (Signed)
Patient ID: Ronnie Ortiz, male   DOB: 01/03/1956, 62 y.o.   MRN: [id number]           Reason for Appointment: Follow-up for Type 2 Diabetes   History of Present Illness:          Diagnosis: Type 2 diabetes mellitus, date of diagnosis: 2011        Past history: He may have had impaired fasting glucose in 2009 and subsequently blood sugars increased gradually His A1c had increased significantly in 3/15 and at that time he was not on any medications He was told to take metformin 1 g twice a day but has been able to tolerate only one tablet today He was referred here in 12/2013 because of persistently high blood sugars and  A1c of 8.7  Recent history:   On his initial consultation he was started on Trulicity  weekly in addition to his 1000 mg metformin Also his metformin was changed to extended release and the dose increased to 1500 mg a day for better tolerability  His A1c had come down to 6.7 on his last visit but he has not been seen in follow-up since 09/2014 His A1c is now up to 11.1 which is the highest on the current record  Current management, medications, blood sugar patterns and problems identified:   He has not taken any of his medications for a year  He thinks he was trying to do better with his diet with cutting back on carbohydrates and portions and trying to lose weight  He has been a little more motivated to get his diabetes under control only recently since he got married  However despite his very high blood sugars he does not complain of any increased thirst or urination or nocturia.  He does tend to get tired but he thinks this is from working long hours  He is generally walking during the day at work  He does not have a functioning meter at home and has not checked his sugar in several months  Previously had taken Trulicity regularly and had no side effects with this, using 0.75 mg   Oral hypoglycemic drugs the patient is taking are: None   Side  effects from medications have been: Diarrhea from high dose Metformin Compliance with the medical regimen: Fairly good  Glucose monitoring:  0  times a day Glucometer:  Unknown       Self-care:  Meals: 3 meals per day. Breakfast is usually a meat and eggs sandwich, lunch is usually a sandwich, dinner his meat and vegetables.  Will have popcorn for snacks      Less sweet tea consumption      Exercise:   walking at work  Dietician visit, most recent: 10/2013.               Weight history:  Wt Readings from Last 3 Encounters:  07/01/17 201 lb 12.8 oz (91.5 kg)  09/28/14 230 lb 6.4 oz (104.5 kg)  04/08/14 230 lb (104.3 kg)    Glycemic control:   Lab Results  Component Value Date   HGBA1C 11.1 07/01/2017   HGBA1C 6.7 (H) 09/22/2014   HGBA1C 6.9 (H) 04/05/2014   Lab Results  Component Value Date   MICROALBUR 1.1 07/01/2017   LDLCALC 121 (H) 09/02/2013   CREATININE 0.97 07/01/2017       Allergies as of 07/01/2017      Reactions   Lipitor [atorvastatin]    Arthralgia  Medication List        Accurate as of 07/01/17  4:14 PM. Always use your most recent med list.          aspirin EC 81 MG tablet Take 1 tablet (81 mg total) by mouth daily.   glucose blood test strip Commonly known as:  ONETOUCH VERIO Use as instructed to check blood sugar once a day   ONETOUCH DELICA LANCETS FINE Misc Use to check blood sugar once daily   pseudoephedrine 30 MG tablet Commonly known as:  SUDAFED Take 30 mg by mouth every 4 (four) hours as needed for congestion.       Allergies:  Allergies  Allergen Reactions  . Lipitor [Atorvastatin]     Arthralgia    Past Medical History:  Diagnosis Date  . HYPERLIPIDEMIA 04/03/2007  . HYPERSOMNIA, ASSOCIATED WITH SLEEP APNEA 04/03/2007  . NEPHROLITHIASIS, HX OF 04/03/2007    Past Surgical History:  Procedure Laterality Date  . BACK SURGERY  1981  . PATELLA RECONSTRUCTION  1970's    Family History  Problem Relation Age of  Onset  . Diabetes Mother   . Heart disease Mother 69       CHF  . Pancreatic cancer Father   . Cancer Father   . Heart attack Brother   . Heart disease Brother     Social History:  reports that he has quit smoking. He has never used smokeless tobacco. He reports that he drank alcohol. He reports that he does not use drugs.    Review of Systems    He does not see his PCP regularly and has not had a preventive checkup in quite some time      Lipids: untreated because of side effects with at least 3 statin drugs in the past, He has tried Livalo also which caused diarrhea Has not had any recent labs       Lab Results  Component Value Date   CHOL 215 (H) 12/09/2013   HDL 31.30 (L) 12/09/2013   LDLCALC 121 (H) 09/02/2013   LDLDIRECT 130.4 12/09/2013   TRIG 333.0 (H) 12/09/2013   CHOLHDL 7 12/09/2013               He has not been on antihypertensive medications in the past  Overdue for eye exams  No recent problems with diarrhea   LABS:  Office Visit on 07/01/2017  Component Date Value Ref Range Status  . Hemoglobin A1C 07/01/2017 11.1   Final  . POC Glucose 07/01/2017 257* 70 - 99 mg/dl Final  . Microalb, Ur 16/12/9602 1.1  0.0 - 1.9 mg/dL Final  . Creatinine,U 54/11/8117 184.3  mg/dL Final  . Microalb Creat Ratio 07/01/2017 0.6  0.0 - 30.0 mg/g Final  . Sodium 07/01/2017 136  135 - 145 mEq/L Final  . Potassium 07/01/2017 4.7  3.5 - 5.1 mEq/L Final  . Chloride 07/01/2017 101  96 - 112 mEq/L Final  . CO2 07/01/2017 28  19 - 32 mEq/L Final  . Glucose, Bld 07/01/2017 242* 70 - 99 mg/dL Final  . BUN 14/78/2956 16  6 - 23 mg/dL Final  . Creatinine, Ser 07/01/2017 0.97  0.40 - 1.50 mg/dL Final  . Total Bilirubin 07/01/2017 0.8  0.2 - 1.2 mg/dL Final  . Alkaline Phosphatase 07/01/2017 77  39 - 117 U/L Final  . AST 07/01/2017 11  0 - 37 U/L Final  . ALT 07/01/2017 14  0 - 53 U/L Final  . Total Protein 07/01/2017  7.1  6.0 - 8.3 g/dL Final  . Albumin 82/95/621304/15/2019 4.0  3.5 -  5.2 g/dL Final  . Calcium 08/65/784604/15/2019 9.1  8.4 - 10.5 mg/dL Final  . GFR 96/29/528404/15/2019 83.72  >60.00 mL/min Final    Physical Examination:  BP 122/78 (BP Location: Left Arm, Patient Position: Sitting, Cuff Size: Normal)   Pulse 63   Ht 6' (1.829 m)   Wt 201 lb 12.8 oz (91.5 kg)   SpO2 98%   BMI 27.37 kg/m      Diabetic Foot Exam - Simple   Simple Foot Form Diabetic Foot exam was performed with the following findings:  Yes   Visual Inspection No deformities, no ulcerations, no other skin breakdown bilaterally:  Yes Sensation Testing Intact to touch and monofilament testing bilaterally:  Yes Pulse Check Posterior Tibialis and Dorsalis pulse intact bilaterally:  Yes Comments     ASSESSMENT:  Diabetes type 2 current BMI 27  See history of present illness for detailed discussion of current diabetes management, blood sugar patterns and problems identified  He is coming back after nearly 3 years and recently on no medication He is minimally symptomatic but likely at least part of his weight loss is related to persistent hyperglycemia, fasting blood sugar today in the office was 257 His A1c is 11.1  Previously A1c had been below 7% for the help of Trulicity in addition to his metformin However he has been totally out of diabetes medications for a year and does not appear to be motivated until recently to take care of his diabetes or monitor it Also does not have a glucose monitor at home  Previously has had relatively good control with Trulicity and metformin without significant side effects However not clear if he may respond again to the same regimen Discussed potential for diabetes progression and insulin deficiency  Hyperlipidemia: Needs follow-up when blood sugars are better  PLAN:   He will resume his Trulicity but start with 0.75 mg to avoid any GI side effects  Restart metformin 1500 mg as before  Add Amaryl 2 mg daily at dinnertime to help initial improvement in  control  Discussed that if he does not have any improvement in his blood sugars may need short-term insulin  He will be given an Accu-Chek meter  Start blood sugar monitoring including some blood sugars after meals, discussed blood sugar targets either fasting or postprandial  To call if he has any side effects with the medications  Consistent low-fat diet and low carbohydrate diet which he is trying to do   Follow-up in 4 weeks  Will also need to reassess his lipids, consider fenofibrate if triglycerides are still high   Patient Instructions  Check blood sugars on waking up  2-3/7  Also check blood sugars about 2 hours after a meal and do this after different meals by rotation  Recommended blood sugar levels on waking up is 90-130 and about 2 hours after meal is 130-160  Please bring your blood sugar monitor to each visit, thank you   Counseling time on subjects discussed in assessment and plan sections is over 50% of today's 25 minute visit   Reather LittlerAjay Markise Haymer 07/01/2017, 4:14 PM   Note: This office note was prepared with Dragon voice recognition system technology. Any transcriptional errors that result from this process are unintentional.

## 2017-07-18 ENCOUNTER — Telehealth: Payer: Self-pay | Admitting: Endocrinology

## 2017-07-18 ENCOUNTER — Other Ambulatory Visit: Payer: Self-pay

## 2017-07-18 MED ORDER — DULAGLUTIDE 0.75 MG/0.5ML ~~LOC~~ SOAJ: SUBCUTANEOUS | 0 refills | Status: DC

## 2017-07-18 NOTE — Telephone Encounter (Signed)
Patient needs RX for Trulicity Pens sent to [id number] - [place], [place] - [address] OF [address]

## 2017-07-18 NOTE — Telephone Encounter (Signed)
New RX sent per pt request.

## 2017-07-24 ENCOUNTER — Other Ambulatory Visit (INDEPENDENT_AMBULATORY_CARE_PROVIDER_SITE_OTHER): Payer: 59

## 2017-07-24 DIAGNOSIS — E1165 Type 2 diabetes mellitus with hyperglycemia: Secondary | ICD-10-CM

## 2017-07-24 LAB — BASIC METABOLIC PANEL
BUN: 13 mg/dL (ref 6–23)
CALCIUM: 9.1 mg/dL (ref 8.4–10.5)
CO2: 28 mEq/L (ref 19–32)
Chloride: 103 mEq/L (ref 96–112)
Creatinine, Ser: 1.14 mg/dL (ref 0.40–1.50)
GFR: 69.47 mL/min (ref >60.00)
GLUCOSE: 125 mg/dL — AB (ref 70–99)
Potassium: 4.1 mEq/L (ref 3.5–5.1)
SODIUM: 139 meq/L (ref 135–145)

## 2017-07-25 LAB — FRUCTOSAMINE: FRUCTOSAMINE: 351 umol/L — AB (ref 0–285)

## 2017-07-28 NOTE — Progress Notes (Signed)
Patient ID: Ronnie Ortiz, male   DOB: 1955/07/26, 62 y.o.   MRN: [id number]           Reason for Appointment: Follow-up for Type 2 Diabetes   History of Present Illness:          Diagnosis: Type 2 diabetes mellitus, date of diagnosis: 2011        Past history: He may have had impaired fasting glucose in 2009 and subsequently blood sugars increased gradually His A1c had increased significantly in 3/15 and at that time he was not on any medications He was told to take metformin 1 g twice a day but has been able to tolerate only one tablet today He was referred here in 12/2013 because of persistently high blood sugars and  A1c of 8.7  Recent history:   On his initial consultation he was started on Trulicity  weekly in addition to his 1000 mg metformin Also his metformin was changed to extended release and the dose increased to 1500 mg a day for better tolerability  His A1c had come down to 6.7 previously but without any medications for a year up to 11.1 which is the highest on the current record  His fructosamine is now 356  Non insulin hypoglycemic drugs the patient is taking are: Trulicity 0.75 mg weekly, metformin 1500 mg daily, Amaryl 2 mg at dinner  Current management, medications, blood sugar patterns and problems identified:   He has gone back to taking his medications as directed as above including Amaryl which is new  He has had no side effects from Trulicity  He has had some initial GI symptoms with his new medication but he is doing well now  He has cut back on his portions and especially the carbohydrate intake recently  Has started checking blood sugar with Accu-Chek but doing readings somewhat irregularly  Blood sugars have been near target since about 07/09/2017  No hypoglycemic symptoms during the day although he had previously lost a lot of weight this has come up only about 4 pounds since his last visit   Side effects from medications have been:  Diarrhea from high dose Metformin Compliance with the medical regimen: Fairly good  Glucose monitoring:  <1 times a day Glucometer:  Accu-Chek guide  Mean values apply above for all meters except median for One Touch  PRE-MEAL Fasting Lunch Dinner Bedtime Overall  Glucose range:  127-235   1 38-162   Mean/median:  170     172         Self-care:  Meals: 3 meals per day. Breakfast is usually a meat and eggs sandwich, lunch is usually a sandwich, dinner his meat and vegetables.  Will have popcorn for snacks           Exercise:   walking at work  Dietician visit, most recent: 10/2013.               Weight history:  Wt Readings from Last 3 Encounters:  07/29/17 204 lb 6.4 oz (92.7 kg)  07/01/17 201 lb 12.8 oz (91.5 kg)  09/28/14 230 lb 6.4 oz (104.5 kg)    Glycemic control:   Lab Results  Component Value Date   HGBA1C 11.1 07/01/2017   HGBA1C 6.7 (H) 09/22/2014   HGBA1C 6.9 (H) 04/05/2014   Lab Results  Component Value Date   MICROALBUR 1.1 07/01/2017   LDLCALC 121 (H) 09/02/2013   CREATININE 1.14 07/24/2017       Allergies as of  07/29/2017      Reactions   Lipitor [atorvastatin]    Arthralgia      Medication List        Accurate as of 07/29/17  8:47 AM. Always use your most recent med list.          aspirin EC 81 MG tablet Take 1 tablet (81 mg total) by mouth daily.   Dulaglutide 0.75 MG/0.5ML Sopn Commonly known as:  TRULICITY Inject contents of one pen weekly   glimepiride 2 MG tablet Commonly known as:  AMARYL Take 1 tablet (2 mg total) by mouth daily before supper.   glucose blood test strip Commonly known as:  ACCU-CHEK GUIDE Use as instructed check blood sugar twice a day   metFORMIN 750 MG 24 hr tablet Commonly known as:  GLUCOPHAGE-XR Take 2 tablets (1,500 mg total) by mouth daily.   ONETOUCH DELICA LANCETS FINE Misc Use to check blood sugar once daily   pseudoephedrine 30 MG tablet Commonly known as:  SUDAFED Take 30 mg by mouth  every 4 (four) hours as needed for congestion.       Allergies:  Allergies  Allergen Reactions  . Lipitor [Atorvastatin]     Arthralgia    Past Medical History:  Diagnosis Date  . HYPERLIPIDEMIA 04/03/2007  . HYPERSOMNIA, ASSOCIATED WITH SLEEP APNEA 04/03/2007  . NEPHROLITHIASIS, HX OF 04/03/2007    Past Surgical History:  Procedure Laterality Date  . BACK SURGERY  1981  . PATELLA RECONSTRUCTION  1970's    Family History  Problem Relation Age of Onset  . Diabetes Mother   . Heart disease Mother 36       CHF  . Pancreatic cancer Father   . Cancer Father   . Heart attack Brother   . Heart disease Brother     Social History:  reports that he has quit smoking. He has never used smokeless tobacco. He reports that he drank alcohol. He reports that he does not use drugs.    Review of Systems         Lipids: untreated because of side effects with at least 3 statin drugs in the past, He has tried Livalo also which caused diarrhea Has not had any recent labs       Lab Results  Component Value Date   CHOL 215 (H) 12/09/2013   HDL 31.30 (L) 12/09/2013   LDLCALC 121 (H) 09/02/2013   LDLDIRECT 130.4 12/09/2013   TRIG 333.0 (H) 12/09/2013   CHOLHDL 7 12/09/2013               He has not been on antihypertensive medications     LABS:  Lab on 07/24/2017  Component Date Value Ref Range Status  . Fructosamine 07/24/2017 351* 0 - 285 umol/L Final   Comment: Published reference interval for apparently healthy subjects between age 54 and 61 is 74 - 285 umol/L and in a poorly controlled diabetic population is 228 - 563 umol/L with a mean of 396 umol/L.   Marland Kitchen Sodium 07/24/2017 139  135 - 145 mEq/L Final  . Potassium 07/24/2017 4.1  3.5 - 5.1 mEq/L Final  . Chloride 07/24/2017 103  96 - 112 mEq/L Final  . CO2 07/24/2017 28  19 - 32 mEq/L Final  . Glucose, Bld 07/24/2017 125* 70 - 99 mg/dL Final  . BUN 69/62/9528 13  6 - 23 mg/dL Final  . Creatinine, Ser 07/24/2017 1.14   0.40 - 1.50 mg/dL Final  . Calcium 41/32/4401 9.1  8.4 - 10.5 mg/dL Final  . GFR 16/12/9602 69.47  >60.00 mL/min Final    Physical Examination:  BP 136/72 (BP Location: Left Arm, Patient Position: Sitting, Cuff Size: Normal)   Pulse 70   Ht 6' (1.829 m)   Wt 204 lb 6.4 oz (92.7 kg)   SpO2 96%   BMI 27.72 kg/m       ASSESSMENT:  Diabetes type 2 current BMI 27  See history of present illness for detailed discussion of current diabetes management, blood sugar patterns and problems identified.  With restarting Trulicity metformin and adding glimepiride his blood sugars are progressively better Most of his blood sugars are near target although fasting readings are still around 120+  Fructosamine indicates fair control but his blood sugars have been mostly better in the last 1-2 weeks  Hyperlipidemia: Needs follow-up fasting labs  PLAN:   He will go up to 1.5 on his Trulicity  No change in other medications as yet  Discussed that if his blood sugars are in the 80s or below and start cutting back the glimepiride to half a tablet and eventually may need to stop this.  More blood sugar monitoring after meals  No change in metformin  Will also need to reassess his lipids, consider fenofibrate if triglycerides are still high   There are no Patient Instructions on file for this visit.     Reather Littler 07/29/2017, 8:47 AM   Note: This office note was prepared with Dragon voice recognition system technology. Any transcriptional errors that result from this process are unintentional.

## 2017-07-29 ENCOUNTER — Encounter: Payer: Self-pay | Admitting: Endocrinology

## 2017-07-29 ENCOUNTER — Ambulatory Visit: Payer: 59 | Admitting: Endocrinology

## 2017-07-29 VITALS — BP 136/72 | HR 70 | Ht 72.0 in | Wt 204.4 lb

## 2017-07-29 DIAGNOSIS — E1165 Type 2 diabetes mellitus with hyperglycemia: Secondary | ICD-10-CM

## 2017-07-29 DIAGNOSIS — E782 Mixed hyperlipidemia: Secondary | ICD-10-CM | POA: Diagnosis not present

## 2017-07-29 MED ORDER — DULAGLUTIDE 1.5 MG/0.5ML ~~LOC~~ SOAJ: SUBCUTANEOUS | 2 refills | Status: DC

## 2017-07-29 MED ORDER — METFORMIN HCL ER 750 MG PO TB24: 1500.0000 mg | ORAL_TABLET | Freq: Every day | ORAL | 1 refills | Status: DC

## 2017-09-23 ENCOUNTER — Other Ambulatory Visit: Payer: Self-pay

## 2017-09-29 NOTE — Progress Notes (Signed)
Patient ID: Ronnie Ortiz, male   DOB: Aug 13, 1955, 62 y.o.   MRN: [id number]           Reason for Appointment: Follow-up for Type 2 Diabetes   History of Present Illness:          Diagnosis: Type 2 diabetes mellitus, date of diagnosis: 2011        Past history: He may have had impaired fasting glucose in 2009 and subsequently blood sugars increased gradually His A1c had increased significantly in 3/15 and at that time he was not on any medications He was told to take metformin 1 g twice a day but has been able to tolerate only one tablet today He was referred here in 12/2013 because of persistently high blood sugars and  A1c of 8.7  Recent history:   On his initial consultation he was started on Trulicity  weekly in addition to his 1000 mg metformin Also his metformin was changed to extended release and the dose increased to 1500 mg a day for better tolerability  His A1c is now markedly better at 6.1, was 11.1 done in 4/19 when he was off his medications   Non insulin hypoglycemic drugs the patient is taking are: Trulicity 1.5 mg weekly, metformin 1500 mg daily, Amaryl 2 mg at dinner  Current management, medications, blood sugar patterns and problems identified:   He has minimal glucose monitoring at home again  He has had no side effects from increasing the Trulicity up to 1.5 mg in 07/2017  He still does get some satiety at meals and often not eating full portions  Overall he tries to do well with his diet and usually eating low carbohydrate meals and not a lot of eating out  Had only one reading after supper lately which was 167  Fasting glucose 123 at home  No hypoglycemic symptoms during the day  Weight has leveled off   Side effects from medications have been: Diarrhea from high dose Metformin Compliance with the medical regimen: Fairly good  Glucose monitoring:  <1 times a day Glucometer:  Accu-Chek guide Blood sugars as above       Self-care:    Meals: 3 meals per day. Breakfast is usually a meat and eggs sandwich, lunch is usually a sandwich, dinner his meat and vegetables.  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               Weight history:  Wt Readings from Last 3 Encounters:  09/30/17 204 lb 6.4 oz (92.7 kg)  07/29/17 204 lb 6.4 oz (92.7 kg)  07/01/17 201 lb 12.8 oz (91.5 kg)    Glycemic control:   Lab Results  Component Value Date   HGBA1C 6.1 (A) 09/30/2017   HGBA1C 11.1 07/01/2017   HGBA1C 6.7 (H) 09/22/2014   Lab Results  Component Value Date   MICROALBUR 1.1 07/01/2017   LDLCALC 121 (H) 09/02/2013   CREATININE 1.14 09/30/2017       Allergies as of 09/30/2017      Reactions   Lipitor [atorvastatin]    Arthralgia      Medication List        Accurate as of 09/30/17 11:33 AM. Always use your most recent med list.          aspirin EC 81 MG tablet Take 1 tablet (81  mg total) by mouth daily.   Dulaglutide 1.5 MG/0.5ML Sopn Commonly known as:  TRULICITY Inject contents of injector device once a week   glimepiride 2 MG tablet Commonly known as:  AMARYL Take 1 tablet (2 mg total) by mouth daily before supper.   glucose blood test strip Commonly known as:  ACCU-CHEK GUIDE Use as instructed check blood sugar twice a day   metFORMIN 750 MG 24 hr tablet Commonly known as:  GLUCOPHAGE-XR Take 2 tablets (1,500 mg total) by mouth daily.   ONETOUCH DELICA LANCETS FINE Misc Use to check blood sugar once daily       Allergies:  Allergies  Allergen Reactions  . Lipitor [Atorvastatin]     Arthralgia    Past Medical History:  Diagnosis Date  . HYPERLIPIDEMIA 04/03/2007  . HYPERSOMNIA, ASSOCIATED WITH SLEEP APNEA 04/03/2007  . NEPHROLITHIASIS, HX OF 04/03/2007    Past Surgical History:  Procedure Laterality Date  . BACK SURGERY  1981  . PATELLA RECONSTRUCTION  1970's    Family History  Problem  Relation Age of Onset  . Diabetes Mother   . Heart disease Mother 7581       CHF  . Pancreatic cancer Father   . Cancer Father   . Heart attack Brother   . Heart disease Brother     Social History:  reports that he has quit smoking. He has never used smokeless tobacco. He reports that he drank alcohol. He reports that he does not use drugs.    Review of Systems         Lipids: Currently not treated because of side effects with at least 3 statin drugs in the past, He has tried Livalo also which caused diarrhea Has not had any recent labs       Lab Results  Component Value Date   CHOL 170 09/30/2017   HDL 33.90 (L) 09/30/2017   LDLCALC 121 (H) 09/02/2013   LDLDIRECT 83.0 09/30/2017   TRIG 301.0 (H) 09/30/2017   CHOLHDL 5 09/30/2017               He has not been on antihypertensive medications     LABS:  Office Visit on 09/30/2017  Component Date Value Ref Range Status  . Hemoglobin A1C 09/30/2017 6.1* 4.0 - 5.6 % Corrected  . Cholesterol 09/30/2017 170  0 - 200 mg/dL Final   ATP III Classification       Desirable:  < 200 mg/dL               Borderline High:  200 - 239 mg/dL          High:  > = 784240 mg/dL  . Triglycerides 09/30/2017 301.0* 0.0 - 149.0 mg/dL Final   Normal:  <696<150 mg/dLBorderline High:  150 - 199 mg/dL  . HDL 09/30/2017 33.90* >39.00 mg/dL Final  . VLDL 29/52/841307/15/2019 60.2* 0.0 - 40.0 mg/dL Final  . Total CHOL/HDL Ratio 09/30/2017 5   Final                  Men          Women1/2 Average Risk     3.4          3.3Average Risk          5.0          4.42X Average Risk          9.6          7.13X Average Risk  15.0          11.0                      . NonHDL 09/30/2017 135.77   Final   NOTE:  Non-HDL goal should be 30 mg/dL higher than patient's LDL goal (i.e. LDL goal of < 70 mg/dL, would have non-HDL goal of < 100 mg/dL)  . Sodium 09/30/2017 140  135 - 145 mEq/L Final  . Potassium 09/30/2017 4.3  3.5 - 5.1 mEq/L Final  . Chloride 09/30/2017 105  96 -  112 mEq/L Final  . CO2 09/30/2017 26  19 - 32 mEq/L Final  . Glucose, Bld 09/30/2017 109* 70 - 99 mg/dL Final  . BUN 16/12/9602 15  6 - 23 mg/dL Final  . Creatinine, Ser 09/30/2017 1.14  0.40 - 1.50 mg/dL Final  . Total Bilirubin 09/30/2017 0.5  0.2 - 1.2 mg/dL Final  . Alkaline Phosphatase 09/30/2017 64  39 - 117 U/L Final  . AST 09/30/2017 12  0 - 37 U/L Final  . ALT 09/30/2017 13  0 - 53 U/L Final  . Total Protein 09/30/2017 6.9  6.0 - 8.3 g/dL Final  . Albumin 54/11/8117 4.0  3.5 - 5.2 g/dL Final  . Calcium 14/78/2956 9.1  8.4 - 10.5 mg/dL Final  . GFR 21/30/8657 69.43  >60.00 mL/min Final  . Direct LDL 09/30/2017 83.0  mg/dL Final   Optimal:  <846 mg/dLNear or Above Optimal:  100-129 mg/dLBorderline High:  130-159 mg/dLHigh:  160-189 mg/dLVery High:  >190 mg/dL    Physical Examination:  BP 124/76 (BP Location: Left Arm, Patient Position: Sitting, Cuff Size: Normal)   Pulse 82   Ht 6' (1.829 m)   Wt 204 lb 6.4 oz (92.7 kg)   SpO2 97%   BMI 27.72 kg/m       ASSESSMENT:  Diabetes type 2 current BMI 27  See history of present illness for detailed discussion of current diabetes management, blood sugar patterns and problems identified.  His A1c is 6.1 which is the best he has had in quite some time  With increasing Trulicity up to 1.5 mg along with metformin and 2 mg glimepiride his blood sugars are very well controlled Although his weight is leveled off he is not significantly obese He is very active and usually diet is fairly good Has only done minimal glucose monitoring at home  Hyperlipidemia: Needs follow-up fasting labs today  PLAN:   He will let us know if he starts having any hypoglycemic symptoms  More blood sugar monitoring after meals  No change in medications  Will also need to reassess his lipids, consider fenofibrate if triglycerides are still high or Zetia if LDL is significantly high  Follow-up in 4 months  There are no Patient Instructions on  file for this visit.     Reather Littler 09/30/2017, 11:33 AM   Note: This office note was prepared with Dragon voice recognition system technology. Any transcriptional errors that result from this process are unintentional.

## 2017-09-30 ENCOUNTER — Ambulatory Visit: Payer: 59 | Admitting: Endocrinology

## 2017-09-30 ENCOUNTER — Encounter: Payer: Self-pay | Admitting: Endocrinology

## 2017-09-30 ENCOUNTER — Other Ambulatory Visit: Payer: Self-pay

## 2017-09-30 VITALS — BP 124/76 | HR 82 | Ht 72.0 in | Wt 204.4 lb

## 2017-09-30 DIAGNOSIS — E1165 Type 2 diabetes mellitus with hyperglycemia: Secondary | ICD-10-CM

## 2017-09-30 DIAGNOSIS — E782 Mixed hyperlipidemia: Secondary | ICD-10-CM

## 2017-09-30 LAB — POCT GLYCOSYLATED HEMOGLOBIN (HGB A1C): HEMOGLOBIN A1C: 6.1 % — AB (ref 4.0–5.6)

## 2017-09-30 LAB — COMPREHENSIVE METABOLIC PANEL
ALK PHOS: 64 U/L (ref 39–117)
ALT: 13 U/L (ref 0–53)
AST: 12 U/L (ref 0–37)
Albumin: 4 g/dL (ref 3.5–5.2)
BILIRUBIN TOTAL: 0.5 mg/dL (ref 0.2–1.2)
BUN: 15 mg/dL (ref 6–23)
CO2: 26 meq/L (ref 19–32)
Calcium: 9.1 mg/dL (ref 8.4–10.5)
Chloride: 105 mEq/L (ref 96–112)
Creatinine, Ser: 1.14 mg/dL (ref 0.40–1.50)
GFR: 69.43 mL/min (ref >60.00)
GLUCOSE: 109 mg/dL — AB (ref 70–99)
Potassium: 4.3 mEq/L (ref 3.5–5.1)
SODIUM: 140 meq/L (ref 135–145)
TOTAL PROTEIN: 6.9 g/dL (ref 6.0–8.3)

## 2017-09-30 LAB — LDL CHOLESTEROL, DIRECT: Direct LDL: 83 mg/dL

## 2017-09-30 LAB — LIPID PANEL
CHOL/HDL RATIO: 5
Cholesterol: 170 mg/dL (ref 0–200)
HDL: 33.9 mg/dL — ABNORMAL LOW (ref >39.00)
NONHDL: 135.77
Triglycerides: 301 mg/dL — ABNORMAL HIGH (ref 0.0–149.0)
VLDL: 60.2 mg/dL — ABNORMAL HIGH (ref 0.0–40.0)

## 2017-09-30 MED ORDER — FENOFIBRATE 145 MG PO TABS: 145.0000 mg | ORAL_TABLET | Freq: Every day | ORAL | 3 refills | Status: DC

## 2017-10-22 ENCOUNTER — Other Ambulatory Visit: Payer: Self-pay | Admitting: Endocrinology

## 2017-12-02 ENCOUNTER — Other Ambulatory Visit: Payer: Self-pay | Admitting: Endocrinology

## 2018-01-02 ENCOUNTER — Other Ambulatory Visit: Payer: Self-pay | Admitting: Endocrinology

## 2018-01-26 ENCOUNTER — Other Ambulatory Visit: Payer: Self-pay | Admitting: Endocrinology

## 2018-01-28 ENCOUNTER — Other Ambulatory Visit: Payer: Self-pay | Admitting: Endocrinology

## 2018-02-03 NOTE — Progress Notes (Signed)
Patient ID: Ronnie Ortiz, male   DOB: 1956-01-19, 62 y.o.   MRN: [id number]           Reason for Appointment: Follow-up for Type 2 Diabetes   History of Present Illness:          Diagnosis: Type 2 diabetes mellitus, date of diagnosis: 2011        Past history: He may have had impaired fasting glucose in 2009 and subsequently blood sugars increased gradually His A1c had increased significantly in 3/15 and at that time he was not on any medications He was told to take metformin 1 g twice a day but has been able to tolerate only one tablet today He was referred here in 12/2013 because of persistently high blood sugars and  A1c of 8.7  Recent history:   On his initial consultation he was started on Trulicity  weekly in addition to his 1000 mg metformin Also his metformin was changed to extended release and the dose increased to 1500 mg a day for better tolerability  His is now lower than expected at 5.6, previously 6.1   Non insulin hypoglycemic drugs the patient is taking are: Trulicity 1.5 mg weekly, metformin 1500 mg daily, Amaryl 2 mg at dinner  Current management, medications, blood sugar patterns and problems identified:   He checks his blood sugars mostly before dinnertime  His glucose meter time is fast by 3 hours  Although he thinks his blood sugars are fluctuating a lot they seem to be about the same at dinnertime with highest reading 154, the same evening after dinner blood sugar was 182  Fasting reading was 125  He had an episode of hypoglycemia with sweating and weakness and blood sugar around 64 late afternoon, he thinks this was an hour after his Trulicity injection  Although his weight seems to be higher he thinks it is mostly from his heavy boots  He does not appear to have any increased satiety now compared to when he first started Trulicity but does not think he is overeating  He is trying to avoid large amounts of carbohydrate especially with his  wife on a gluten-free diet  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 Breakfast is usually a meat and eggs sandwich, lunch is usually a sandwich, dinner his meat and vegetables.  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               Weight history:  Wt Readings from Last 3 Encounters:  02/04/18 214 lb (97.1 kg)  09/30/17 204 lb 6.4 oz (92.7 kg)  07/29/17 204 lb 6.4 oz (92.7 kg)    Glycemic control:   Lab Results  Component Value Date   HGBA1C 5.6 02/04/2018   HGBA1C 6.1 (A) 09/30/2017   HGBA1C 11.1 07/01/2017   Lab Results  Component Value Date   MICROALBUR 1.1 07/01/2017   LDLCALC 121 (H) 09/02/2013   CREATININE 1.14 09/30/2017       Allergies as of 02/04/2018  Reactions   Lipitor [atorvastatin]    Arthralgia      Medication List        Accurate as of 02/04/18  8:22 AM. Always use your most recent med list.          aspirin EC 81 MG tablet Take 1 tablet (81 mg total) by mouth daily.   fenofibrate 145 MG tablet Commonly known as:  TRICOR Take 1 tablet (145 mg total) by mouth daily.   glimepiride 2 MG tablet Commonly known as:  AMARYL TAKE 1 TABLET BY MOUTH DAILY WITH SUPPER.   glucose blood test strip Use as instructed check blood sugar twice a day   metFORMIN 750 MG 24 hr tablet Commonly known as:  GLUCOPHAGE-XR Take 2 tablets (1,500 mg total) by mouth daily.   ONETOUCH DELICA LANCETS FINE Misc Use to check blood sugar once daily   TRULICITY 1.5 MG/0.5ML Sopn Generic drug:  Dulaglutide INJECT CONTENTS OF INJECTOR DEVICE UNDER THE SKIN ONCE  WEEKLY       Allergies:  Allergies  Allergen Reactions  . Lipitor [Atorvastatin]     Arthralgia    Past Medical History:  Diagnosis Date  . HYPERLIPIDEMIA 04/03/2007  . HYPERSOMNIA, ASSOCIATED WITH SLEEP APNEA 04/03/2007  . NEPHROLITHIASIS, HX OF 04/03/2007    Past Surgical History:  Procedure Laterality Date  . BACK SURGERY  1981  . PATELLA RECONSTRUCTION  1970's    Family History  Problem Relation Age of Onset  . Diabetes Mother   . Heart disease Mother 66       CHF  . Pancreatic cancer Father   . Cancer Father   . Heart attack Brother   . Heart disease Brother     Social History:  reports that he has quit smoking. He has never used smokeless tobacco. He reports that he drank alcohol. He reports that he does not use drugs.    Review of Systems         Lipids:  He has had hyperlipidemia with mostly high triglycerides Not taking fenofibrate because of persistently high readings of triglycerides over 300 now  Not on statin because of side effects with at least 3 statin drugs in the past, He has tried Livalo also which caused diarrhea Has not had any recent labs       Lab Results  Component Value Date   CHOL 170 09/30/2017   HDL 33.90 (L) 09/30/2017   LDLCALC 121 (H) 09/02/2013   LDLDIRECT 83.0 09/30/2017   TRIG 301.0 (H) 09/30/2017   CHOLHDL 5 09/30/2017               He has not been on antihypertensive medications with no history of consistently high blood pressure  BP Readings from Last 3 Encounters:  02/04/18 132/84  09/30/17 124/76  07/29/17 136/72    Report of eye exam not available    LABS:  Office Visit on 02/04/2018  Component Date Value Ref Range Status  . Hemoglobin A1C 02/04/2018 5.6  4.0 - 5.6 % Final    Physical Examination:  BP 132/84   Pulse 73   Ht 6\' 2"  (1.88 m)   Wt 214 lb (97.1 kg)   SpO2 97%   BMI 27.48 kg/m       ASSESSMENT:  Diabetes type 2 current BMI 27  See history of present illness for detailed  discussion of current diabetes management, blood sugar patterns and problems identified.  His A1c is lower at 5.6  Not clear why  his A1c is low since his blood sugars are mostly over 100 before dinnertime and was 125 fasting at home  He did experience hypoglycemia no once in the afternoon and also alert to time which was not documented Overall trying to be active and generally watching his diet However may not be getting as much satiety with Trulicity more recently even with 1.5 dosage  Hyperlipidemia: Needs follow-up fasting labs today to assess efficacy of fenofibrate Discussed need for controlling high triglycerides  PLAN:   He will reduce Amaryl to half a tablet  More blood sugar testing after evening meal and also some fasting  To call if blood sugars are not consistent  Consider Ozempic if weight gain continues Influenza vaccine given  Patient Instructions  Check blood sugars on waking up 3 days a week  Also check blood sugars about 2 hours after meals and do this after different meals by rotation  Recommended blood sugar levels on waking up are 90-130 and about 2 hours after meal is 130-160  Please bring your blood sugar monitor to each visit, thank you        Reather LittlerAjay Telesia Ates 02/04/2018, 8:22 AM   Note: This office note was prepared with Dragon voice recognition system technology. Any transcriptional errors that result from this process are unintentional.

## 2018-02-04 ENCOUNTER — Encounter: Payer: Self-pay | Admitting: Endocrinology

## 2018-02-04 ENCOUNTER — Ambulatory Visit: Payer: 59 | Admitting: Endocrinology

## 2018-02-04 VITALS — BP 132/84 | HR 73 | Ht 74.0 in | Wt 214.0 lb

## 2018-02-04 DIAGNOSIS — Z23 Encounter for immunization: Secondary | ICD-10-CM | POA: Diagnosis not present

## 2018-02-04 DIAGNOSIS — E1165 Type 2 diabetes mellitus with hyperglycemia: Secondary | ICD-10-CM | POA: Diagnosis not present

## 2018-02-04 DIAGNOSIS — E782 Mixed hyperlipidemia: Secondary | ICD-10-CM

## 2018-02-04 LAB — COMPREHENSIVE METABOLIC PANEL
ALBUMIN: 4.5 g/dL (ref 3.5–5.2)
ALK PHOS: 55 U/L (ref 39–117)
ALT: 26 U/L (ref 0–53)
AST: 20 U/L (ref 0–37)
BUN: 22 mg/dL (ref 6–23)
CO2: 27 mEq/L (ref 19–32)
CREATININE: 1.3 mg/dL (ref 0.40–1.50)
Calcium: 9.7 mg/dL (ref 8.4–10.5)
Chloride: 102 mEq/L (ref 96–112)
GFR: 59.6 mL/min — ABNORMAL LOW (ref >60.00)
Glucose, Bld: 114 mg/dL — ABNORMAL HIGH (ref 70–99)
Potassium: 4.4 mEq/L (ref 3.5–5.1)
SODIUM: 139 meq/L (ref 135–145)
TOTAL PROTEIN: 8 g/dL (ref 6.0–8.3)
Total Bilirubin: 0.5 mg/dL (ref 0.2–1.2)

## 2018-02-04 LAB — LIPID PANEL
Cholesterol: 176 mg/dL (ref 0–200)
HDL: 38.5 mg/dL — ABNORMAL LOW (ref >39.00)
LDL Cholesterol: 107 mg/dL — ABNORMAL HIGH (ref 0–99)
NonHDL: 137.8
Total CHOL/HDL Ratio: 5
Triglycerides: 156 mg/dL — ABNORMAL HIGH (ref 0.0–149.0)
VLDL: 31.2 mg/dL (ref 0.0–40.0)

## 2018-02-04 LAB — POCT GLYCOSYLATED HEMOGLOBIN (HGB A1C): HEMOGLOBIN A1C: 5.6 % (ref 4.0–5.6)

## 2018-02-04 NOTE — Patient Instructions (Addendum)
Check blood sugars on waking up 3 days a week  Also check blood sugars about 2 hours after meals and do this after different meals by rotation  Recommended blood sugar levels on waking up are 90-130 and about 2 hours after meal is 130-160  Please bring your blood sugar monitor to each visit, thank you   Amaryl 1/2 at dinner

## 2018-02-23 ENCOUNTER — Other Ambulatory Visit: Payer: Self-pay | Admitting: Endocrinology

## 2018-03-22 ENCOUNTER — Other Ambulatory Visit: Payer: Self-pay | Admitting: Endocrinology

## 2018-05-01 ENCOUNTER — Other Ambulatory Visit: Payer: Self-pay | Admitting: Endocrinology

## 2018-05-08 ENCOUNTER — Ambulatory Visit: Payer: 59 | Admitting: Endocrinology

## 2018-05-08 ENCOUNTER — Encounter: Payer: Self-pay | Admitting: Endocrinology

## 2018-05-08 VITALS — BP 122/76 | HR 83 | Ht 74.0 in | Wt 218.4 lb

## 2018-05-08 DIAGNOSIS — E782 Mixed hyperlipidemia: Secondary | ICD-10-CM

## 2018-05-08 DIAGNOSIS — E119 Type 2 diabetes mellitus without complications: Secondary | ICD-10-CM | POA: Diagnosis not present

## 2018-05-08 LAB — URINALYSIS, ROUTINE W REFLEX MICROSCOPIC
Bilirubin Urine: NEGATIVE
Hgb urine dipstick: NEGATIVE
Ketones, ur: NEGATIVE
Leukocytes,Ua: NEGATIVE
NITRITE: NEGATIVE
TOTAL PROTEIN, URINE-UPE24: NEGATIVE
Urine Glucose: NEGATIVE
Urobilinogen, UA: 0.2 (ref 0.0–1.0)
pH: 5.5 (ref 5.0–8.0)

## 2018-05-08 LAB — BASIC METABOLIC PANEL
BUN: 23 mg/dL (ref 6–23)
CO2: 27 mEq/L (ref 19–32)
Calcium: 9.7 mg/dL (ref 8.4–10.5)
Chloride: 102 mEq/L (ref 96–112)
Creatinine, Ser: 1.33 mg/dL (ref 0.40–1.50)
GFR: 54.57 mL/min — ABNORMAL LOW (ref >60.00)
GLUCOSE: 111 mg/dL — AB (ref 70–99)
Potassium: 4.3 mEq/L (ref 3.5–5.1)
Sodium: 138 mEq/L (ref 135–145)

## 2018-05-08 LAB — LIPID PANEL
Cholesterol: 189 mg/dL (ref 0–200)
HDL: 45 mg/dL (ref >39.00)
LDL Cholesterol: 118 mg/dL — ABNORMAL HIGH (ref 0–99)
NonHDL: 144.12
Total CHOL/HDL Ratio: 4
Triglycerides: 130 mg/dL (ref 0.0–149.0)
VLDL: 26 mg/dL (ref 0.0–40.0)

## 2018-05-08 LAB — MICROALBUMIN / CREATININE URINE RATIO
Creatinine,U: 227.6 mg/dL
Microalb Creat Ratio: 0.7 mg/g (ref 0.0–30.0)
Microalb, Ur: 1.5 mg/dL (ref 0.0–1.9)

## 2018-05-08 LAB — POCT GLYCOSYLATED HEMOGLOBIN (HGB A1C): Hemoglobin A1C: 6.9 % — AB (ref 4.0–5.6)

## 2018-05-08 MED ORDER — CANAGLIFLOZIN 100 MG PO TABS: ORAL_TABLET | ORAL | 3 refills | Status: AC

## 2018-05-08 NOTE — Progress Notes (Signed)
Please call to let patient know that the lab results are normal except cholesterol can be about 15-20 % better.  Stay on the same treatment

## 2018-05-08 NOTE — Progress Notes (Signed)
Patient ID: Ronnie Ortiz, male   DOB: 1955/04/12, 63 y.o.   MRN: [id number]           Reason for Appointment: Follow-up for Type 2 Diabetes   History of Present Illness:          Diagnosis: Type 2 diabetes mellitus, date of diagnosis: 2011        Past history: He may have had impaired fasting glucose in 2009 and subsequently blood sugars increased gradually His A1c had increased significantly in 3/15 and at that time he was not on any medications He was told to take metformin 1 g twice a day but has been able to tolerate only one tablet today He was referred here in 12/2013 because of persistently high blood sugars and  A1c of 8.7  On his initial consultation he was started on Trulicity  weekly in addition to his 1000 mg metformin Also his metformin was changed to extended release and the dose increased to 1500 mg a day for better tolerability  Recent history:     His is now significantly higher at 6.9 compared to 5.6  Non insulin hypoglycemic drugs the patient is taking are: Trulicity 1.5 mg weekly, metformin 1500 mg daily, Amaryl 2 mg half tablet at dinner  Current management, medications, blood sugar patterns and problems identified:   He did not bring his monitor for download  Although he thinks his morning sugars are high not clear how often he is monitoring them  Also he thinks his blood sugars are not any higher after meals  Last year had gained 10 pounds and again has gained another 4 pounds  Does not think he has as much portion control with Trulicity as initially  However he thinks he is usually eating low fat meals and avoiding excess carbohydrate usually  Generally not as active in wintertime   Side effects from medications have been: Diarrhea from high dose Metformin Compliance with the medical regimen: Fairly good  Glucose monitoring:  <1 times a day Glucometer:  Accu-Chek guide  Blood sugars by recall range from about 140-164 before breakfast  and 150+ after meals         Self-care:  Meals: 3 meals per day. Breakfast is usually a meat and eggs sandwich, lunch is usually a sandwich, dinner: meat and vegetables.  Will have popcorn for snacks           Exercise:   Variable activity recently  Dietician visit, most recent: 10/2013.               Weight history:  Wt Readings from Last 3 Encounters:  05/08/18 218 lb 6.4 oz (99.1 kg)  02/04/18 214 lb (97.1 kg)  09/30/17 204 lb 6.4 oz (92.7 kg)    Glycemic control:   Lab Results  Component Value Date   HGBA1C 6.9 (A) 05/08/2018   HGBA1C 5.6 02/04/2018   HGBA1C 6.1 (A) 09/30/2017   Lab Results  Component Value Date   MICROALBUR 1.1 07/01/2017   LDLCALC 107 (H) 02/04/2018   CREATININE 1.30 02/04/2018       Allergies as of 05/08/2018      Reactions   Lipitor [atorvastatin]    Arthralgia      Medication List       Accurate as of May 08, 2018 12:49 PM. Always use your most recent med list.        aspirin EC 81 MG tablet Take 1 tablet (81 mg total) by mouth daily.  canagliflozin 100 MG Tabs tablet Commonly known as:  INVOKANA 1 tablet before breakfast   fenofibrate 145 MG tablet Commonly known as:  TRICOR TAKE 1 TABLET(145 MG) BY MOUTH DAILY   glimepiride 2 MG tablet Commonly known as:  AMARYL TAKE 1 TABLET BY MOUTH DAILY WITH SUPPER   glucose blood test strip Commonly known as:  ACCU-CHEK GUIDE Use as instructed check blood sugar twice a day   metFORMIN 750 MG 24 hr tablet Commonly known as:  GLUCOPHAGE-XR Take 2 tablets (1,500 mg total) by mouth daily.   ONETOUCH DELICA LANCETS FINE Misc Use to check blood sugar once daily   TRULICITY 1.5 MG/0.5ML Sopn Generic drug:  Dulaglutide INJECT CONETENTS OF 1 SYRINGE UNDER THE SKIN ONCE WEEKLY       Allergies:  Allergies  Allergen Reactions  . Lipitor [Atorvastatin]     Arthralgia    Past Medical History:  Diagnosis Date  . HYPERLIPIDEMIA 04/03/2007  . HYPERSOMNIA, ASSOCIATED WITH  SLEEP APNEA 04/03/2007  . NEPHROLITHIASIS, HX OF 04/03/2007    Past Surgical History:  Procedure Laterality Date  . BACK SURGERY  1981  . PATELLA RECONSTRUCTION  1970's    Family History  Problem Relation Age of Onset  . Diabetes Mother   . Heart disease Mother 5581       CHF  . Pancreatic cancer Father   . Cancer Father   . Heart attack Brother   . Heart disease Brother     Social History:  reports that he has quit smoking. He has never used smokeless tobacco. He reports previous alcohol use. He reports that he does not use drugs.    Review of Systems        HYPERLIPIDEMIA:  He has had hyperlipidemia with mostly high triglycerides Continues to be taking fenofibrate because of persistently high readings of triglycerides over 300 now  Not on statin because of side effects with at least 3 statin drugs in the past, He has tried Livalo also which caused diarrhea        Lab Results  Component Value Date   CHOL 176 02/04/2018   HDL 38.50 (L) 02/04/2018   LDLCALC 107 (H) 02/04/2018   LDLDIRECT 83.0 09/30/2017   TRIG 156.0 (H) 02/04/2018   CHOLHDL 5 02/04/2018               He has not been on antihypertensive medications with no history of  high blood pressure  BP Readings from Last 3 Encounters:  05/08/18 122/76  02/04/18 132/84  09/30/17 124/76   He is overdue for his eye exam    LABS:  Office Visit on 05/08/2018  Component Date Value Ref Range Status  . Hemoglobin A1C 05/08/2018 6.9* 4.0 - 5.6 % Final    Physical Examination:  BP 122/76 (BP Location: Left Arm, Patient Position: Standing, Cuff Size: Large)   Pulse 83   Ht 6\' 2"  (1.88 m)   Wt 218 lb 6.4 oz (99.1 kg)   SpO2 97%   BMI 28.04 kg/m       ASSESSMENT:  Diabetes type 2 current BMI 28  See history of present illness for detailed discussion of current diabetes management, blood sugar patterns and problems identified.  His A1c is 6.9  Although he thinks his blood sugars are mostly in the  140-160 range his A1c has gone up significantly He is also having difficulty losing weight Previously Amaryl was reduced because of occasional tendency to low normal sugars or occasional hypoglycemia He  is not as active although he thinks is diet is fairly good but may not always be controlling portions  LIPIDS: Will need follow-up, currently only on fenofibrate    PLAN:  Discussed action of SGLT 2 drugs on lowering glucose by decreasing kidney absorption of glucose, benefits of weight loss and lower blood pressure, possible side effects including candidiasis and dosage regimen   He will start Invokana 100 mg daily  At this time he will stop his low-dose Amaryl  Morning blood sugar monitoring after meals Discussed blood sugar targets at various time Start walking 30 minutes daily at least Recheck A1c in 3 months  Patient Instructions  30 min walking  Check blood sugars on waking up 3 days a week  Also check blood sugars about 2 hours after meals and do this after different meals by rotation  Recommended blood sugar levels on waking up are 90-130 and about 2 hours after meal is 130-160  Please bring your blood sugar monitor to each visit, thank you  Stop Glimeperide        Reather Littler 05/08/2018, 12:49 PM   Note: This office note was prepared with Dragon voice recognition system technology. Any transcriptional errors that result from this process are unintentional.

## 2018-05-08 NOTE — Patient Instructions (Addendum)
30 min walking  Check blood sugars on waking up 3 days a week  Also check blood sugars about 2 hours after meals and do this after different meals by rotation  Recommended blood sugar levels on waking up are 90-130 and about 2 hours after meal is 130-160  Please bring your blood sugar monitor to each visit, thank you  Stop Glimeperide

## 2018-05-27 ENCOUNTER — Other Ambulatory Visit: Payer: Self-pay

## 2018-05-27 MED ORDER — ONETOUCH DELICA PLUS LANCET33G MISC: 1.0000 | 3 refills | Status: AC

## 2018-05-27 MED ORDER — GLUCOSE BLOOD VI STRP: ORAL_STRIP | 3 refills | Status: AC

## 2018-06-18 ENCOUNTER — Other Ambulatory Visit: Payer: Self-pay

## 2018-06-18 ENCOUNTER — Telehealth: Payer: Self-pay | Admitting: Endocrinology

## 2018-06-18 MED ORDER — METFORMIN HCL ER 750 MG PO TB24: 1500.0000 mg | ORAL_TABLET | Freq: Every day | ORAL | 1 refills | Status: AC

## 2018-06-18 NOTE — Telephone Encounter (Signed)
Rx sent 

## 2018-06-18 NOTE — Telephone Encounter (Signed)
MEDICATION: metFORMIN (GLUCOPHAGE-XR) 750 MG 24 hr tablet  PHARMACY:  [id number] - [place], [place] - [address] AT [address] OF [address]  IS THIS A 90 DAY SUPPLY :  Yes   IS PATIENT OUT OF MEDICATION:  YES   IF NOT; HOW MUCH IS LEFT:   LAST APPOINTMENT DATE: @3 /12/2018  NEXT APPOINTMENT DATE:@5 /21/2020  DO WE HAVE YOUR PERMISSION TO LEAVE A DETAILED MESSAGE:  OTHER COMMENTS:    **Let patient know to contact pharmacy at the end of the day to make sure medication is ready. **  ** Please notify patient to allow 48-72 hours to process**  **Encourage patient to contact the pharmacy for refills or they can request refills through [facility]**

## 2018-07-05 ENCOUNTER — Other Ambulatory Visit: Payer: Self-pay | Admitting: Endocrinology

## 2018-08-07 ENCOUNTER — Ambulatory Visit: Payer: Self-pay | Admitting: Endocrinology

## 2018-08-31 NOTE — Progress Notes (Signed)
This encounter was created in error - please disregard.

## 2018-09-01 ENCOUNTER — Encounter: Payer: 59 | Admitting: Endocrinology

## 2018-10-29 ENCOUNTER — Telehealth: Payer: Self-pay | Admitting: Endocrinology

## 2018-10-29 NOTE — Telephone Encounter (Signed)
LMTCB and reschedule missed appointment °

## 2018-10-29 NOTE — Telephone Encounter (Signed)
-----   Message from Arnoldo Hooker sent at 10/29/2018  9:18 AM EDT ----- Per Dr Dwyane Dee this patient needs to be rescheduled for missed, appointment, same day labs OK

## 2018-10-29 NOTE — Telephone Encounter (Signed)
Called patient and LMTCB and reschedule missed appointment with same day labs as requested by Dr Dwyane Dee

## 2018-10-31 NOTE — Telephone Encounter (Signed)
LMTCB (X2) and reschedule missed appointment °

## 2018-11-05 ENCOUNTER — Encounter: Payer: Self-pay | Admitting: Endocrinology

## 2019-05-29 ENCOUNTER — Ambulatory Visit: Payer: 59 | Attending: Internal Medicine

## 2019-05-29 DIAGNOSIS — Z23 Encounter for immunization: Secondary | ICD-10-CM

## 2019-05-29 NOTE — Progress Notes (Signed)
   Covid-[facility] Vaccination [facility]  Name:  Ronnie Ortiz    MRN: [id number] DOB: 1955/09/08  05/29/2019  Mr. Ronnie Ortiz was observed post Covid-19 immunization for 15 minutes without incident. He was provided with Vaccine Information Sheet and instruction to access the V-Safe system.   Mr. Ronnie Ortiz was instructed to call 911 with any severe reactions post vaccine: Marland Kitchen Difficulty breathing  . Swelling of face and throat  . A fast heartbeat  . A bad rash all over body  . Dizziness and weakness   Immunizations Administered    Name Date Dose VIS Date Route   Pfizer COVID-19 Vaccine 05/29/2019  2:40 PM 0.3 mL 02/27/2019 Intramuscular   Manufacturer: [employer], [employer]   Lot: [id number]   NDC: [address]-2

## 2019-06-23 ENCOUNTER — Ambulatory Visit: Payer: 59 | Attending: Internal Medicine

## 2019-06-23 DIAGNOSIS — Z23 Encounter for immunization: Secondary | ICD-10-CM

## 2019-06-23 NOTE — Progress Notes (Signed)
   Covid-19 Vaccination Clinic  Name:  [name]    MRN: [id number] DOB: [birth year]  06/23/2019  Mr. Dawes was observed post Covid-19 immunization for 15 minutes without incident. He was provided with Vaccine Information Sheet and instruction to access the V-Safe system.   Mr. Hartsell was instructed to call 911 with any severe reactions post vaccine: Marland Kitchen Difficulty breathing  . Swelling of face and throat  . A fast heartbeat  . A bad rash all over body  . Dizziness and weakness   Immunizations Administered    Name Date Dose VIS Date Route   Pfizer COVID-19 Vaccine 06/23/2019 12:53 PM 0.3 mL 02/27/2019 Intramuscular   Manufacturer: [employer], [employer]   Lot: [id number]   NDC: [address]-7

## 2020-10-17 ENCOUNTER — Encounter: Payer: Self-pay | Admitting: Gastroenterology

## 2024-01-10 DIAGNOSIS — Z87891 Personal history of nicotine dependence: Secondary | ICD-10-CM | POA: Diagnosis not present

## 2024-01-10 DIAGNOSIS — G720 Drug-induced myopathy: Secondary | ICD-10-CM | POA: Diagnosis not present

## 2024-01-10 DIAGNOSIS — Z833 Family history of diabetes mellitus: Secondary | ICD-10-CM | POA: Diagnosis not present

## 2024-01-10 DIAGNOSIS — Z8249 Family history of ischemic heart disease and other diseases of the circulatory system: Secondary | ICD-10-CM | POA: Diagnosis not present

## 2024-01-10 DIAGNOSIS — Z7984 Long term (current) use of oral hypoglycemic drugs: Secondary | ICD-10-CM | POA: Diagnosis not present

## 2024-01-10 DIAGNOSIS — M1990 Unspecified osteoarthritis, unspecified site: Secondary | ICD-10-CM | POA: Diagnosis not present

## 2024-01-10 DIAGNOSIS — Z809 Family history of malignant neoplasm, unspecified: Secondary | ICD-10-CM | POA: Diagnosis not present

## 2024-01-10 DIAGNOSIS — R030 Elevated blood-pressure reading, without diagnosis of hypertension: Secondary | ICD-10-CM | POA: Diagnosis not present

## 2024-01-10 DIAGNOSIS — E119 Type 2 diabetes mellitus without complications: Secondary | ICD-10-CM | POA: Diagnosis not present

## 2024-03-13 ENCOUNTER — Other Ambulatory Visit: Payer: Self-pay | Admitting: Internal Medicine

## 2024-03-13 DIAGNOSIS — Z87891 Personal history of nicotine dependence: Secondary | ICD-10-CM

## 2024-03-13 DIAGNOSIS — Z136 Encounter for screening for cardiovascular disorders: Secondary | ICD-10-CM

## 2024-03-25 ENCOUNTER — Ambulatory Visit
Admission: RE | Admit: 2024-03-25 | Discharge: 2024-03-25 | Disposition: A | Discharge status: Home / Self Care | Source: Ambulatory Visit | Attending: Internal Medicine | Admitting: Internal Medicine

## 2024-03-25 DIAGNOSIS — Z136 Encounter for screening for cardiovascular disorders: Secondary | ICD-10-CM | POA: Diagnosis present

## 2024-03-25 DIAGNOSIS — Z87891 Personal history of nicotine dependence: Secondary | ICD-10-CM | POA: Insufficient documentation
